# Patient Record
Sex: Female | Born: 1995 | Race: White | Hispanic: No | Marital: Married | Smoking: Never smoker
Health system: Southern US, Community
[De-identification: ages and names within clinical notes are randomized; demographics above are authoritative.]

## PROBLEM LIST (undated history)

## (undated) ENCOUNTER — Emergency Department (HOSPITAL_COMMUNITY): Admission: EM | Payer: Managed Care, Other (non HMO)

## (undated) DIAGNOSIS — N6001 Solitary cyst of right breast: Secondary | ICD-10-CM

## (undated) DIAGNOSIS — D649 Anemia, unspecified: Secondary | ICD-10-CM

## (undated) DIAGNOSIS — F419 Anxiety disorder, unspecified: Secondary | ICD-10-CM

## (undated) DIAGNOSIS — N6002 Solitary cyst of left breast: Secondary | ICD-10-CM

## (undated) DIAGNOSIS — K589 Irritable bowel syndrome without diarrhea: Secondary | ICD-10-CM

## (undated) HISTORY — PX: COLONOSCOPY: SHX174

## (undated) HISTORY — PX: UPPER GI ENDOSCOPY: SHX6162

## (undated) HISTORY — PX: MINOR HEMORRHOIDECTOMY: SHX6238

## (undated) HISTORY — PX: OTHER SURGICAL HISTORY: SHX169

## (undated) HISTORY — DX: Solitary cyst of right breast: N60.02

## (undated) HISTORY — DX: Solitary cyst of right breast: N60.01

## (undated) HISTORY — DX: Anxiety disorder, unspecified: F41.9

## (undated) HISTORY — DX: Irritable bowel syndrome without diarrhea: K58.9

## (undated) HISTORY — DX: Anemia, unspecified: D64.9

## (undated) HISTORY — PX: WISDOM TOOTH EXTRACTION: SHX21

---
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
## 2016-06-27 ENCOUNTER — Encounter: Payer: Self-pay | Admitting: General Surgery

## 2016-06-28 ENCOUNTER — Encounter (INDEPENDENT_AMBULATORY_CARE_PROVIDER_SITE_OTHER): Payer: Self-pay

## 2016-07-05 ENCOUNTER — Encounter: Payer: Self-pay | Admitting: General Surgery

## 2016-07-05 ENCOUNTER — Ambulatory Visit (INDEPENDENT_AMBULATORY_CARE_PROVIDER_SITE_OTHER): Payer: Managed Care, Other (non HMO) | Admitting: General Surgery

## 2016-07-05 VITALS — BP 128/80 | HR 82 | Resp 12 | Ht 66.0 in | Wt 116.0 lb

## 2016-07-05 DIAGNOSIS — N644 Mastodynia: Secondary | ICD-10-CM | POA: Diagnosis not present

## 2016-07-05 NOTE — Progress Notes (Signed)
Patient ID: Savannah Mills, female   DOB: 1995-10-28, 21 y.o.   MRN: [id number]  Chief Complaint  Patient presents with  . Other    Breast Cysts    HPI Savannah Mills is a 21 y.o. female for evaluation of breast cysts. She reports that she has breast cysts that come and go and are painful at times and she has had this since the age of 88. She reports that she had solid feeling breast cysts in both breasts for the past 6 months, but they now have gone away. She reports the cysts tend to come around her periods. She changed birth control about 3-4 months ago.  She has used aspirin and antihistamine for this past flair up. She cut out caffeine from her diet.   HPI  Past Medical History:  Diagnosis Date  . Anemia   . Anxiety   . Bilateral breast cysts   . IBS (irritable bowel syndrome)     Past Surgical History:  Procedure Laterality Date  . COLONOSCOPY  6 years  . ingrown toenail excision    . UPPER GI ENDOSCOPY  6 years ago  . WISDOM TOOTH EXTRACTION      Family History  Problem Relation Age of Onset  . Skin cancer Maternal Grandmother   . Breast cancer Maternal Aunt     Social History Social History  Substance Use Topics  . Smoking status: Never Smoker  . Smokeless tobacco: Never Used  . Alcohol use Yes    No Known Allergies  Current Outpatient Prescriptions  Medication Sig Dispense Refill  . norelgestromin-ethinyl estradiol (ORTHO EVRA) 150-35 MCG/24HR transdermal patch Place 1 patch onto the skin once a week.      No current facility-administered medications for this visit.     Review of Systems Review of Systems  Constitutional: Negative.   Respiratory: Negative.   Cardiovascular: Negative.     Blood pressure 128/80, pulse 82, resp. rate 12, height  (1.676 m), weight 116 lb (52.6 kg), last menstrual period 06/30/2016.  Physical Exam Physical Exam  Constitutional: She is oriented to person, place, and time. She appears well-developed and  well-nourished.  Eyes: Conjunctivae are normal. No scleral icterus.  Neck: Neck supple.  Cardiovascular: Normal rate, regular rhythm and normal heart sounds.   Pulmonary/Chest: Effort normal and breath sounds normal. Right breast exhibits tenderness (inferrior and lateral). Right breast exhibits no inverted nipple, no mass, no nipple discharge and no skin change (right breast larger than left). Left breast exhibits no inverted nipple, no mass, no nipple discharge, no skin change and no tenderness. Breasts are asymmetrical.    The right breast is 1/2 cup size larger than the left, baseline by patient report.  Lymphadenopathy:    She has no cervical adenopathy.    She has no axillary adenopathy.  Neurological: She is alert and oriented to person, place, and time.  Skin: Skin is warm and dry.  Psychiatric: She has a normal mood and affect.    Data Reviewed GYN notes from 06/07/2006 report bilateral breast tenderness and a history of cyclic moving to daily tenderness. Clinical exam reports "diffuse cystic breasts with diffuse tenderness). Breast asymmetry noted. Trial of ibuprofen 800 mg 3 times a day 1 month with caffeine cessation without reported benefit.  Assessment    Clinical history consistent with breast cyst, none evident on today's exam, mastalgia.    Plan    At this time there is no indication for ultrasound. With a benign clinical exam  if cyst right identified aspiration would not be of benefit, if cyst were not identified it would not disprove the clinical suspicion of cyclical breast cysts.  Patient has been encouraged to make use of a trial of antioxidant vitamins such as Ocuvite/Protegra twice a day expecting results in about 3 months if inflammation is a significant component to her discomfort.  No indication for change in contraceptives.      HPI, Physical Exam, Assessment and Plan have been scribed under the direction and in the presence of Earline Mayotte,  MD  Carron Brazen, LPN  I have completed the exam and reviewed the above documentation for accuracy and completeness.  I agree with the above.  Museum/gallery conservator has been used and any errors in dictation or transcription are unintentional.  Donnalee Curry, M.D., F.A.C.S.   Earline Mayotte 07/06/2016, 6:39 AM

## 2016-07-05 NOTE — Patient Instructions (Addendum)
Continue self breast exams. Call office for any new breast issues or concerns.  Call for follow up if breast cysts occur.  May use Ocuvite with Lutein or Protegra twice daily for the next 3 months.  Send My chart message regarding supplements after 3 months.

## 2016-07-06 ENCOUNTER — Encounter: Payer: Self-pay | Admitting: *Deleted

## 2016-07-06 DIAGNOSIS — N644 Mastodynia: Secondary | ICD-10-CM | POA: Insufficient documentation

## 2018-03-27 NOTE — L&D Delivery Note (Addendum)
Operative Delivery Note At 9:12 AM a viable female infant was delivered via Vaginal, Forceps.  Presentation: vertex; Position: Right,, Occiput,, Posterior; Station: outlet.  Verbal consent: obtained from patient.  Risks and benefits discussed in detail.  Risks include, but are not limited to the risks of anesthesia, bleeding, infection, damage to maternal tissues, fetal cephalhematoma.  There is also the risk of inability to effect vaginal delivery of the head, or shoulder dystocia that cannot be resolved by established maneuvers, leading to the need for emergency cesarean section.  APGAR: 8, 9; weight  pending.   Placenta status: delivered spontaneously, intact.   Cord: 3VC with the following complications: none.  Cord pH: pending  Anesthesia:  epidural Instruments: Pricilla Holmucker McLane Forceps Episiotomy: None Lacerations: 2nd degree;Vaginal Suture Repair: 3.0 vicryl QBL. Blood Loss (mL):  pending  Mom to postpartum.  Baby to Couplet care / Skin to Skin.  Of note: I was called by Dr. Dalbert GarnetBeasley to see the patient on an emergent basis due to persistent outlet station of the fetal head even with persistent and good maternal effort.  I was briefed on the prior attempts to replace the fetus for rotation, which was unscuccesful for sustained rotation to affect delivery.  The patient was given a bolus of neuraxial anesthesia prior to any procedure.  While I was in the room I attempted to rotate the head between maternal pushing efforts with small success to OP.  Discussed with the patient that options included an attempt at forceps delivery and if no descent of the fetal head and delivery, she would need a c-section by Dr. Dalbert GarnetBeasley.  The alternative was to proceed directly to cesarean delivery.  The patient elected for a brief and gentle attempt with forceps prior to proceeding with cesarean delivery.  Dr. Dalbert GarnetBeasley was present and scrubbed and in the room with me.  The bladder was drained. The patient was consented  for attempted forceps delivery.  Risks were discussed with her in detail.  She opted for attempt at vaginal delivery.  Neonatology was present.   The Vanceucker McLane forceps were applied. The posterior-left blade was first positioned. The anterior blade was then placed with support by Dr. Dalbert GarnetBeasley I insured the blade did not lacerate maternal tissue.  Once positioned, a check was made for clearance above the fetal skin and that the axis of the forceps was along the sagittal suture.  Once assured of all safety checks, gentle downward traction in a straight line was applied.  Movement of the fetal head was noted, but delivery was not imminent.  Between contractions the blades were loosened.  With the second contraction and second maternal push, delivery of the fetal head was imminent and the blades were disarticulated and removed.  Support of the perineum was provided by Heloise Ochoaebecca McVey, CNM, during the procedure.  The shoulders, followed by the rest of the body were delivered without difficulty.  The infant was placed on the mom's chest.  He was immediately vigorous.  Cord clamped and cut after > 1 min delay.  No cord blood obtained.  Blood from the cord for cord pH was obtained, though the tracing was reassuring throughout my involvement in the delivery.  The placenta delivered spontaneously, intact, with a 3-vessel cord.  Second degree perineal laceration repaired by Dr. Dalbert GarnetBeasley with 2-0 and 3-0 Vicryl in standard fashion.  There was a small hemostatic laceration along the right vaginal sidewall at the left of the pubic ramus. All counts correct.  Hemostasis obtained with  IV pitocin, fundal massage, and misoprostol 800 mcg rectally. QBL pending and did not appear excessive while I was in the room.     Prentice Docker, MD 12/15/2018, 9:26 AM    Addendum to Dr. Marisue Brooklyn note as above:  23yo G1P0 at 41+0wks presenting for iol for late term pregnancy. She progressed to fully dilated but with excellent epidural,  had poor pushing efforts with CNM. Laboring down for two hours with increased maternal sensation, she began pushing over an intact perineum. After almost 4 hours of pushing, she began making more consistent progress. I entered the room for evaluation. I found an ROT baby with a persistent Cat I strip, clear fluid, and no maternal fever or fetal tachycardia.  After several pushes with excellent effort and strong uterine contractions, no movement of the ROT head was noted. Caput was noted, with mildly overriding sutures,. I de-stationed and flexed the fetal head, trying to turn to direct OP or OA position, and was minimally successful. After discussing with patient, I placed a kiwi just anterior to the fetal vertex and pulled gently to see if I could turn the baby enough to extend under the pubic arch. This was also unsuccessful. I did not have a pop-off and did not pull firmly for delivery. However, I was able to see that forceps placement instead of a true trial of the vacuum would be more likely to be successful, and asked Dr. Glennon Mac to assess. He agreed to attempt an operative vaginal delivery as above.

## 2018-05-23 ENCOUNTER — Other Ambulatory Visit: Payer: Self-pay | Admitting: Obstetrics and Gynecology

## 2018-05-23 DIAGNOSIS — Z369 Encounter for antenatal screening, unspecified: Secondary | ICD-10-CM

## 2018-05-23 LAB — OB RESULTS CONSOLE HEPATITIS B SURFACE ANTIGEN: Hepatitis B Surface Ag: NEGATIVE

## 2018-05-23 LAB — OB RESULTS CONSOLE HIV ANTIBODY (ROUTINE TESTING): HIV: NONREACTIVE

## 2018-05-23 LAB — OB RESULTS CONSOLE GC/CHLAMYDIA
Chlamydia: NEGATIVE
Gonorrhea: NEGATIVE

## 2018-05-23 LAB — OB RESULTS CONSOLE RUBELLA ANTIBODY, IGM: Rubella: NON-IMMUNE/NOT IMMUNE

## 2018-05-23 LAB — OB RESULTS CONSOLE RPR: RPR: NONREACTIVE

## 2018-05-23 LAB — OB RESULTS CONSOLE VARICELLA ZOSTER ANTIBODY, IGG: Varicella: NON-IMMUNE/NOT IMMUNE

## 2018-05-30 ENCOUNTER — Ambulatory Visit: Payer: Self-pay

## 2018-06-03 ENCOUNTER — Ambulatory Visit
Admission: RE | Admit: 2018-06-03 | Discharge: 2018-06-03 | Disposition: A | Discharge status: Home / Self Care | Payer: Managed Care, Other (non HMO) | Source: Ambulatory Visit | Attending: Obstetrics and Gynecology | Admitting: Obstetrics and Gynecology

## 2018-06-03 DIAGNOSIS — Z3A13 13 weeks gestation of pregnancy: Secondary | ICD-10-CM | POA: Diagnosis not present

## 2018-06-03 DIAGNOSIS — Z369 Encounter for antenatal screening, unspecified: Secondary | ICD-10-CM

## 2018-06-03 NOTE — Progress Notes (Signed)
Savannah Mills of Consultation: 30 minutes   Savannah Mills  was referred to [facility] [place] for genetic counseling to review prenatal screening and testing options.  This note summarizes the information we discussed.    Genetic Counseling We offered the following routine screening tests for this pregnancy:  First trimester screening, which includes nuchal translucency ultrasound screen and first trimester maternal serum marker screening.  The nuchal translucency has approximately an 80% detection rate for Down syndrome and can be positive for other chromosome abnormalities as well as congenital heart defects.  When combined with a maternal serum marker screening, the detection rate is up to 90% for Down syndrome and up to 97% for trisomy 18.     Maternal serum marker screening, a blood test that measures pregnancy proteins, can provide risk assessments for Down syndrome, trisomy 18, and open neural tube defects (spina bifida, anencephaly). Because it does not directly examine the fetus, it cannot positively diagnose or rule out these problems.  Targeted ultrasound uses high frequency sound waves to create an image of the developing fetus.  An ultrasound is often recommended as a routine means of evaluating the pregnancy.  It is also used to screen for fetal anatomy problems (for example, a heart defect) that might be suggestive of a chromosomal or other abnormality.   Should these screening tests indicate an increased concern, then the following additional testing options would be offered:  The chorionic villus sampling procedure is available for first trimester chromosome analysis.  This involves the withdrawal of a small amount of chorionic villi (tissue from the developing placenta).  Risk of pregnancy loss is estimated to be approximately 1 in 200 to 1 in 100 (0.5 to 1%).  There is approximately a 1% (1 in 100) chance that the CVS chromosome results will be  unclear.  Chorionic villi cannot be tested for neural tube defects.     Amniocentesis involves the removal of a small amount of amniotic fluid from the sac surrounding the fetus with the use of a thin needle inserted through the maternal abdomen and uterus.  Ultrasound guidance is used throughout the procedure.  Fetal cells from amniotic fluid are directly evaluated and > 99.5% of chromosome problems and > 98% of open neural tube defects can be detected. This procedure is generally performed after the 15th week of pregnancy.  The main risks to this procedure include complications leading to miscarriage in less than 1 in 200 cases (0.5%).  As another option for information if the pregnancy is suspected to be an an increased chance for certain chromosome conditions, we also reviewed the availability of cell free fetal DNA testing from maternal blood to determine whether or not the baby may have either Down syndrome, trisomy 77, or trisomy 50.  This test utilizes a maternal blood sample and DNA sequencing technology to isolate circulating cell free fetal DNA from maternal plasma.  The fetal DNA can then be analyzed for DNA sequences that are derived from the three most common chromosomes involved in aneuploidy, chromosomes 13, 18, and 21.  If the overall amount of DNA is greater than the expected level for any of these chromosomes, aneuploidy is suspected.  While we do not consider it a replacement for invasive testing and karyotype analysis, a negative result from this testing would be reassuring, though not a guarantee of a normal chromosome complement for the baby.  An abnormal result is certainly suggestive of an abnormal chromosome complement, though we would  still recommend CVS or amniocentesis to confirm any findings from this testing.  Cystic Fibrosis and Spinal Muscular Atrophy (SMA) screening were also discussed with the patient. Both conditions are recessive, which means that both parents must be  carriers in order to have a child with the disease.  Cystic fibrosis (CF) is one of the most common genetic conditions in persons of Caucasian ancestry.  This condition occurs in approximately 1 in 2,500 Caucasian persons and results in thickened secretions in the lungs, digestive, and reproductive systems.  For a baby to be at risk for having CF, both of the parents must be carriers for this condition.  Approximately 1 in 81 Caucasian persons is a carrier for CF.  Current carrier testing looks for the most common mutations in the gene for CF and can detect approximately 90% of carriers in the Caucasian population.  This means that the carrier screening can greatly reduce, but cannot eliminate, the chance for an individual to have a child with CF.  If an individual is found to be a carrier for CF, then carrier testing would be available for the partner. As part of [place] newborn screening profile, all babies born in the state of [place] will have a two-tier screening process.  Specimens are first tested to determine the concentration of immunoreactive trypsinogen (IRT).  The top 5% of specimens with the highest IRT values then undergo DNA testing using a panel of over 40 common CF mutations. SMA is a neurodegenerative disorder that leads to atrophy of skeletal muscle and overall weakness.  This condition is also more prevalent in the Caucasian population, with 1 in 40-1 in 60 persons being a carrier and 1 in 6,000-1 in 10,000 children being affected.  There are multiple forms of the disease, with some causing death in infancy to other forms with survival into adulthood.  The genetics of SMA is complex, but carrier screening can detect up to 95% of carriers in the Caucasian population.  Similar to CF, a negative result can greatly reduce, but cannot eliminate, the chance to have a child with SMA.  Pregnancy/Family History We obtained a detailed family history and pregnancy history.This is the 1st  pregnancy for the patient and her husband, Savannah Mills, age 48.  Savannah Mills reports that her maternal aunt lost a daughter at 31 months of age.  She was born with a congenital heart defect and passed away during her 2nd operation.  The patient has no other details regarding her cousin's diagnosis.  We discussed that congenital heart defects (CHDs) occur in approximately 1 in 200 births.  Congenital heart defects are often thought to be multifactorial, meaning due to complex interactions between genetic and environmental factors, although some specific genetic changes and syndromes are associated with congenital heart defects.  It would be important to know what tests were done on her cousin in order to better assess the risk in this family.   In the absence of an underlying genetic condition, the chance of congenital heart defects in 2nd degree relatives with a CHD is thought to be low. We reviewed the utility of a 2nd trimester anatomy scan in assessing fetal heart development.  The patient reports Caucasian ancestry for herself and her partner. The remainder of the family history was reported to be unremarkable for birth defects, intellectual delays, recurrent pregnancy loss or known chromosome abnormalities.  Savannah Mills denies any pregnancy complications.  She denies significant exposures of drug use during pregnancy.    Plan  After consideration of the options, Savannah Mills elected to proceed with first trimester screening.  She declines carrier screening.  An ultrasound was performed at the time of the visit.  The gestational age was consistent with [redacted]w[redacted]d.  NT measurement is 1.6 mm.  Fetal anatomy could not be assessed due to early gestational age.  Please refer to the ultrasound report for details of that study.  Savannah Mills was encouraged to call with questions or concerns.  We can be contacted at [phone].  Labs ordered: First trimester screen ([facility])  Savannah Ferron, MS, CGC performed an  integral service incident to the physician's initial service.  I was physically present in the clinical area and was immediately available to render assistance.   Sufian Ravi C Mckenna Boruff

## 2018-06-05 LAB — MISC LABCORP TEST (SEND OUT): LabCorp test name: 17500

## 2018-06-10 ENCOUNTER — Telehealth: Payer: Self-pay | Admitting: Obstetrics and Gynecology

## 2018-06-10 NOTE — Telephone Encounter (Signed)
  Savannah Mills elected to undergo First Trimester screening on 06/03/2018.  To review, first trimester screening, includes nuchal translucency ultrasound screen and/or first trimester maternal serum marker screening.  The nuchal translucency has approximately an 80% detection rate for Down syndrome and can be positive for other chromosome abnormalities as well as heart defects.  When combined with a maternal serum marker screening, the detection rate is up to 90% for Down syndrome and up to 97% for trisomy 18.     The results of the First Trimester Nuchal Translucency and Biochemical Screening were within normal range.  The risk for Down syndrome is now estimated to be less than 1 in 10,000.  The risk for Trisomy 77 is also estimated to be less than 1 in 10,000. This result does not include assessment for Trisomy 13, as the testing was sent to [facility] per patient request due to insurance coverage.  Should more definitive information be desired, we would offer amniocentesis.  Because we do not yet know the effectiveness of combined first and second trimester screening, we do not recommend a maternal serum screen to assess the chance for chromosome conditions.  However, if screening for neural tube defects is desired, maternal serum screening for AFP only can be performed between 15 and [redacted] weeks gestation.    We may be reached at [phone] with questions or concerns.  Savannah Anderson, MS, CGC

## 2018-11-11 LAB — OB RESULTS CONSOLE GC/CHLAMYDIA
Chlamydia: NEGATIVE
Gonorrhea: NEGATIVE

## 2018-11-11 LAB — OB RESULTS CONSOLE GBS: GBS: NEGATIVE

## 2018-11-20 LAB — OB RESULTS CONSOLE RPR: RPR: NONREACTIVE

## 2018-12-10 ENCOUNTER — Other Ambulatory Visit: Payer: Self-pay | Admitting: Certified Nurse Midwife

## 2018-12-10 NOTE — Progress Notes (Signed)
  Savannah Mills is a 23 y.o. G1P0 female dated by LMP c/w [redacted]w[redacted]d ultrasound on 04/25/2018.     Pregnancy Issues: 1. Hemorrhoids 2. 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 Varicella non-immune 4. Rubella non-immune 5. Anemia on iron supplementation 6. Fetal pyelectasis, right renal pelvis=0.63cm left renal pelvis=0.60cm at 32w  Prenatal care site: [facility]  Prenatal Labs: Blood type/Rh AB+  Antibody screen neg  Rubella Non-immune  Varicella Non-immune  RPR NR  HBsAg Neg  HIV NR  GC neg  Chlamydia neg  Genetic screening negative  1 hour GTT 123  3 hour GTT n/a  GBS negative   Post Partum Planning: - Infant feeding: breast - Contraception: interested in hormonal IUD

## 2018-12-12 ENCOUNTER — Other Ambulatory Visit
Admission: RE | Admit: 2018-12-12 | Discharge: 2018-12-12 | Disposition: A | Discharge status: Home / Self Care | Payer: Managed Care, Other (non HMO) | Source: Ambulatory Visit | Attending: Obstetrics & Gynecology | Admitting: Obstetrics & Gynecology

## 2018-12-12 ENCOUNTER — Other Ambulatory Visit: Payer: Self-pay

## 2018-12-12 DIAGNOSIS — Z01812 Encounter for preprocedural laboratory examination: Secondary | ICD-10-CM | POA: Insufficient documentation

## 2018-12-12 DIAGNOSIS — Z20828 Contact with and (suspected) exposure to other viral communicable diseases: Secondary | ICD-10-CM | POA: Diagnosis not present

## 2018-12-12 LAB — SARS CORONAVIRUS 2 (TAT 6-24 HRS): SARS Coronavirus 2: NEGATIVE

## 2018-12-14 ENCOUNTER — Inpatient Hospital Stay
Admission: EM | Admit: 2018-12-14 | Discharge: 2018-12-17 | DRG: 806 | Disposition: A | Discharge status: Home / Self Care | Payer: Managed Care, Other (non HMO) | Attending: Obstetrics and Gynecology | Admitting: Obstetrics and Gynecology

## 2018-12-14 ENCOUNTER — Inpatient Hospital Stay: Payer: Managed Care, Other (non HMO) | Admitting: Anesthesiology

## 2018-12-14 ENCOUNTER — Other Ambulatory Visit: Payer: Self-pay

## 2018-12-14 DIAGNOSIS — D649 Anemia, unspecified: Secondary | ICD-10-CM | POA: Diagnosis present

## 2018-12-14 DIAGNOSIS — Z20828 Contact with and (suspected) exposure to other viral communicable diseases: Secondary | ICD-10-CM | POA: Diagnosis present

## 2018-12-14 DIAGNOSIS — A6000 Herpesviral infection of urogenital system, unspecified: Secondary | ICD-10-CM | POA: Diagnosis present

## 2018-12-14 DIAGNOSIS — O9902 Anemia complicating childbirth: Principal | ICD-10-CM | POA: Diagnosis present

## 2018-12-14 DIAGNOSIS — O26893 Other specified pregnancy related conditions, third trimester: Secondary | ICD-10-CM | POA: Diagnosis present

## 2018-12-14 DIAGNOSIS — Z3A41 41 weeks gestation of pregnancy: Secondary | ICD-10-CM | POA: Diagnosis not present

## 2018-12-14 DIAGNOSIS — O480 Post-term pregnancy: Secondary | ICD-10-CM | POA: Diagnosis not present

## 2018-12-14 DIAGNOSIS — Z3A40 40 weeks gestation of pregnancy: Secondary | ICD-10-CM | POA: Diagnosis not present

## 2018-12-14 DIAGNOSIS — O358XX0 Maternal care for other (suspected) fetal abnormality and damage, not applicable or unspecified: Secondary | ICD-10-CM | POA: Diagnosis present

## 2018-12-14 DIAGNOSIS — O9832 Other infections with a predominantly sexual mode of transmission complicating childbirth: Secondary | ICD-10-CM | POA: Diagnosis present

## 2018-12-14 DIAGNOSIS — O2243 Hemorrhoids in pregnancy, third trimester: Secondary | ICD-10-CM | POA: Diagnosis present

## 2018-12-14 LAB — CBC
HCT: 32.1 % — ABNORMAL LOW (ref 36.0–46.0)
Hemoglobin: 10.9 g/dL — ABNORMAL LOW (ref 12.0–15.0)
MCH: 28.6 pg (ref 26.0–34.0)
MCHC: 34 g/dL (ref 30.0–36.0)
MCV: 84.3 fL (ref 80.0–100.0)
Platelets: 174 10*3/uL (ref 150–400)
RBC: 3.81 MIL/uL — ABNORMAL LOW (ref 3.87–5.11)
RDW: 15.7 % — ABNORMAL HIGH (ref 11.5–15.5)
WBC: 9.8 10*3/uL (ref 4.0–10.5)
nRBC: 0 % (ref 0.0–0.2)

## 2018-12-14 LAB — TYPE AND SCREEN
ABO/RH(D): AB POS
Antibody Screen: NEGATIVE

## 2018-12-14 LAB — RPR: RPR Ser Ql: NONREACTIVE

## 2018-12-14 MED ORDER — FENTANYL CITRATE (PF) 100 MCG/2ML IJ SOLN
INTRAMUSCULAR | Status: AC
  Filled 2018-12-14: qty 2

## 2018-12-14 MED ORDER — LACTATED RINGERS IV SOLN: 500.0000 mL | INTRAVENOUS | Status: DC | PRN

## 2018-12-14 MED ORDER — AMMONIA AROMATIC IN INHA
RESPIRATORY_TRACT | Status: AC
  Filled 2018-12-14: qty 10

## 2018-12-14 MED ORDER — LACTATED RINGERS IV SOLN
INTRAVENOUS | Status: DC
  Administered 2018-12-14 – 2018-12-15 (×2): via INTRAVENOUS

## 2018-12-14 MED ORDER — OXYTOCIN 10 UNIT/ML IJ SOLN
INTRAMUSCULAR | Status: AC
  Filled 2018-12-14: qty 2

## 2018-12-14 MED ORDER — LIDOCAINE HCL (PF) 1 % IJ SOLN
INTRAMUSCULAR | Status: AC
  Filled 2018-12-14: qty 30

## 2018-12-14 MED ORDER — FENTANYL 2.5 MCG/ML W/ROPIVACAINE 0.15% IN NS 100 ML EPIDURAL (ARMC)
EPIDURAL | Status: DC | PRN
  Administered 2018-12-14: 12 mL/h via EPIDURAL

## 2018-12-14 MED ORDER — OXYTOCIN 40 UNITS IN NORMAL SALINE INFUSION - SIMPLE MED: 2.5000 [IU]/h | INTRAVENOUS | Status: DC

## 2018-12-14 MED ORDER — TERBUTALINE SULFATE 1 MG/ML IJ SOLN: 0.2500 mg | Freq: Once | INTRAMUSCULAR | Status: DC | PRN

## 2018-12-14 MED ORDER — OXYTOCIN 40 UNITS IN NORMAL SALINE INFUSION - SIMPLE MED
1.0000 m[IU]/min | INTRAVENOUS | Status: DC
  Administered 2018-12-14: 1 m[IU]/min via INTRAVENOUS
  Filled 2018-12-14: qty 1000

## 2018-12-14 MED ORDER — ACETAMINOPHEN 325 MG PO TABS
650.0000 mg | ORAL_TABLET | ORAL | Status: DC | PRN
  Administered 2018-12-15: 650 mg via ORAL
  Filled 2018-12-14: qty 2

## 2018-12-14 MED ORDER — MISOPROSTOL 100 MCG PO TABS
25.0000 ug | ORAL_TABLET | ORAL | Status: DC | PRN
  Administered 2018-12-14 (×2): 25 ug via BUCCAL
  Filled 2018-12-14 (×2): qty 1

## 2018-12-14 MED ORDER — FENTANYL 2.5 MCG/ML W/ROPIVACAINE 0.15% IN NS 100 ML EPIDURAL (ARMC)
EPIDURAL | Status: AC
  Filled 2018-12-14: qty 100

## 2018-12-14 MED ORDER — EPHEDRINE 5 MG/ML INJ
10.0000 mg | INTRAVENOUS | Status: DC | PRN
  Filled 2018-12-14: qty 2

## 2018-12-14 MED ORDER — SOD CITRATE-CITRIC ACID 500-334 MG/5ML PO SOLN
30.0000 mL | ORAL | Status: DC | PRN
  Filled 2018-12-14: qty 30

## 2018-12-14 MED ORDER — BUTORPHANOL TARTRATE 1 MG/ML IJ SOLN: 1.0000 mg | INTRAMUSCULAR | Status: DC | PRN

## 2018-12-14 MED ORDER — MISOPROSTOL 200 MCG PO TABS
ORAL_TABLET | ORAL | Status: AC
  Filled 2018-12-14: qty 4

## 2018-12-14 MED ORDER — OXYTOCIN BOLUS FROM INFUSION
500.0000 mL | Freq: Once | INTRAVENOUS | Status: AC
  Administered 2018-12-15: 500 mL via INTRAVENOUS

## 2018-12-14 MED ORDER — FENTANYL 2.5 MCG/ML W/ROPIVACAINE 0.15% IN NS 100 ML EPIDURAL (ARMC)
12.0000 mL/h | EPIDURAL | Status: DC
  Administered 2018-12-14: 12 mL/h via EPIDURAL
  Administered 2018-12-15: 09:00:00 9 ug via EPIDURAL
  Filled 2018-12-14: qty 100

## 2018-12-14 MED ORDER — FENTANYL CITRATE (PF) 100 MCG/2ML IJ SOLN
100.0000 ug | INTRAMUSCULAR | Status: DC | PRN
  Administered 2018-12-14: 100 ug via INTRAVENOUS

## 2018-12-14 MED ORDER — DIPHENHYDRAMINE HCL 50 MG/ML IJ SOLN: 12.5000 mg | INTRAMUSCULAR | Status: DC | PRN

## 2018-12-14 MED ORDER — LIDOCAINE HCL (PF) 1 % IJ SOLN
30.0000 mL | INTRAMUSCULAR | Status: AC | PRN
  Administered 2018-12-14: 17:00:00 1.2 mL via SUBCUTANEOUS

## 2018-12-14 MED ORDER — PHENYLEPHRINE 40 MCG/ML (10ML) SYRINGE FOR IV PUSH (FOR BLOOD PRESSURE SUPPORT)
80.0000 ug | PREFILLED_SYRINGE | INTRAVENOUS | Status: DC | PRN
  Filled 2018-12-14: qty 10

## 2018-12-14 MED ORDER — MISOPROSTOL 25 MCG QUARTER TABLET
ORAL_TABLET | ORAL | Status: AC
  Filled 2018-12-14: qty 2

## 2018-12-14 MED ORDER — LACTATED RINGERS IV SOLN: 500.0000 mL | Freq: Once | INTRAVENOUS | Status: DC

## 2018-12-14 MED ORDER — LIDOCAINE-EPINEPHRINE (PF) 1.5 %-1:200000 IJ SOLN
INTRAMUSCULAR | Status: DC | PRN
  Administered 2018-12-14: 3 mL via EPIDURAL

## 2018-12-14 MED ORDER — OXYTOCIN 40 UNITS IN NORMAL SALINE INFUSION - SIMPLE MED
INTRAVENOUS | Status: AC
  Filled 2018-12-14: qty 1000

## 2018-12-14 MED ORDER — MISOPROSTOL 25 MCG QUARTER TABLET
25.0000 ug | ORAL_TABLET | ORAL | Status: DC | PRN
  Administered 2018-12-14 (×2): 25 ug via VAGINAL
  Filled 2018-12-14 (×2): qty 1

## 2018-12-14 MED ORDER — ONDANSETRON HCL 4 MG/2ML IJ SOLN
4.0000 mg | Freq: Four times a day (QID) | INTRAMUSCULAR | Status: DC | PRN
  Administered 2018-12-14: 4 mg via INTRAVENOUS
  Filled 2018-12-14: qty 2

## 2018-12-14 NOTE — Anesthesia Procedure Notes (Signed)
Epidural Patient location during procedure: OB Start time: 12/14/2018 4:25 PM End time: 12/14/2018 4:50 PM  Staffing Performed: anesthesiologist   Preanesthetic Checklist Completed: patient identified, site marked, surgical consent, pre-op evaluation, timeout performed, IV checked, risks and benefits discussed and monitors and equipment checked  Epidural Patient position: sitting Prep: Betadine Patient monitoring: heart rate, continuous pulse ox and blood pressure Approach: midline Location: L4-L5 Injection technique: LOR saline  Needle:  Needle type: Tuohy  Needle gauge: 17 G Needle length: 9 cm and 9 Needle insertion depth: 8 cm Catheter type: closed end flexible Catheter size: 20 Guage Catheter at skin depth: 12 cm Test dose: negative and 1.5% lidocaine with Epi 1:200 K  Assessment Events: blood not aspirated, injection not painful, no injection resistance, negative IV test and no paresthesia  Additional Notes   Patient tolerated the insertion well without complications.Reason for block:procedure for pain

## 2018-12-14 NOTE — Progress Notes (Signed)
Labor Progress Note  Savannah Mills is a 23 y.o. G1P0 at [redacted]w[redacted]d by LMP admitted for induction of labor due to late term.  Subjective:  Hurting with UCs, requesting pain meds prior to cervical exam.   Objective: BP 115/82   Pulse 80   Temp 98.5 F (36.9 C) (Oral)   Resp 16   Ht 5\' 6"  (1.676 m)   Wt 73.9 kg   LMP 03/03/2018   SpO2 100%   BMI 26.31 kg/m  Notable VS details: reviewed  Fetal Assessment: FHT:  FHR: 150 bpm, variability: moderate,  accelerations:  Present,  decelerations:  Absent Category/reactivity:  Category I UC:  Q5min SVE:   2/90/-2, soft, midposition Membrane status: ruptured, scant amount clear fluid around 1400 per pt.  Amniotic color: clear  Labs: Lab Results  Component Value Date   WBC 9.8 12/14/2018   HGB 10.9 (L) 12/14/2018   HCT 32.1 (L) 12/14/2018   MCV 84.3 12/14/2018   PLT 174 12/14/2018    Assessment / Plan: IOl at 40+6wks, early labor  Labor: progressing after 2 doses of cytotec and SROM.  Preeclampsia:  no e/o pre-e Fetal Wellbeing:  Category I Pain Control:  IV pain meds I/D:  n/a Anticipated MOD:  NSVD  Francetta Found, CNM 12/14/2018, 1530

## 2018-12-14 NOTE — H&P (Signed)
OB History & Physical   History of Present Illness:  Chief Complaint: here for induction  HPI:  Savannah Mills is a 23 y.o. G1P0 female at [redacted]w[redacted]d dated by LMP c/w [redacted]w[redacted]d ultrasound on 04/25/2018.  She presents to L&D for late term induction.   Active FM; not feeling any UCs, denies LOF or VB. Denies HSV prodrome sx.    Pregnancy Issues: 1. Hemorrhoids 2. 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 Varicella non-immune 4. Rubella non-immune 5. Anemia on iron supplementation 6. Fetal pyelectasis, right renal pelvis=0.63cm left renal pelvis=0.60cm at 32w   Maternal Medical History:   Past Medical History:  Diagnosis Date  . Anemia   . Anxiety   . Bilateral breast cysts   . IBS (irritable bowel syndrome)     Past Surgical History:  Procedure Laterality Date  . COLONOSCOPY  6 years  . ingrown toenail excision    . MINOR HEMORRHOIDECTOMY    . UPPER GI ENDOSCOPY  6 years ago  . WISDOM TOOTH EXTRACTION      Allergies  Allergen Reactions  . Cinnamon Anaphylaxis    Prior to Admission medications   Medication Sig Start Date End Date Taking? Authorizing Provider  cetirizine (ZYRTEC) 5 MG tablet Take 5 mg by mouth daily.   Yes [provider]  ferrous sulfate 325 (65 FE) MG tablet Take 325 mg by mouth daily with breakfast.   Yes [provider]  Prenatal Vit-Fe Fumarate-FA (PRENATAL MULTIVITAMIN) TABS tablet Take 1 tablet by mouth daily at 12 noon.   Yes [provider]  valACYclovir (VALTREX) 500 MG tablet Take 500 mg by mouth 2 (two) times daily.   Yes [provider]     Prenatal care site: [facility] History: She  reports that she has never smoked. She has never used smokeless tobacco. She reports previous alcohol use. She reports that she does not use drugs.  Family History: family history includes Alcoholism in her mother; Anxiety disorder in her brother; Breast cancer in her maternal  aunt; Cancer in her maternal grandmother and mother; Depression in her mother and sister; Diabetes in her maternal grandfather; Skin cancer in her maternal grandmother.   Review of Systems: A full review of systems was performed and negative except as noted in the HPI.     Physical Exam:  Vital Signs: BP 129/81   Pulse 66   Temp 98.4 F (36.9 C) (Oral)   Resp 14   Ht 5\' 6"  (1.676 m)   Wt 73.9 kg   LMP 03/03/2018   BMI 26.31 kg/m  General: no acute distress.  HEENT: normocephalic, atraumatic Heart: regular rate & rhythm.  No murmurs/rubs/gallops Lungs: clear to auscultation bilaterally, normal respiratory effort Abdomen: soft, gravid, non-tender;  EFW: 7lbs Pelvic: cervix posterior and located to pt left, soft and thinning.   External: Normal external female genitalia, negative spec exam- no e/o HSV lesions.   Cervix: Dilation: Closed / Effacement (%): 50 / Station: -2    Extremities: non-tender, symmetric, no edema bilaterally.  DTRs: 2+  Neurologic: Alert & oriented x 3.    Results for orders placed or performed during the hospital encounter of 12/14/18 (from the past 24 hour(s))  CBC     Status: Abnormal   Collection Time: 12/14/18  6:10 AM  Result Value Ref Range   WBC 9.8 4.0 - 10.5 K/uL   RBC 3.81 (L) 3.87 - 5.11 MIL/uL  Hemoglobin 10.9 (L) 12.0 - 15.0 g/dL   HCT 16.132.1 (L) 09.636.0 - 04.546.0 %   MCV 84.3 80.0 - 100.0 fL   MCH 28.6 26.0 - 34.0 pg   MCHC 34.0 30.0 - 36.0 g/dL   RDW 40.915.7 (H) 81.111.5 - 91.415.5 %   Platelets 174 150 - 400 K/uL   nRBC 0.0 0.0 - 0.2 %  Type and screen     Status: None   Collection Time: 12/14/18  6:10 AM  Result Value Ref Range   ABO/RH(D) AB POS    Antibody Screen NEG    Sample Expiration      12/17/2018,2359 Performed at [facility], [address] [place], [place] [address]    COVID: negative 9/17  Pertinent Results:  Prenatal Labs: Blood type/Rh  AB Pos  Antibody screen neg  Rubella  NON-Immune  Varicella  NON-Immune  RPR  NR  HBsAg Neg  HIV NR  GC neg  Chlamydia neg  Genetic screening negative  1 hour GTT  123  GBS  negative   FHT:  140bpm, moderate variability, + accels, no decels TOCO: Irregular, q 2-515min, mild SVE:  Dilation: Closed / Effacement (%): 50 / Station: -2    Cephalic by leopolds  No results found.  Assessment:  Savannah Mills is a 23 y.o. G1P0 female at 457w6d with late term induction of labor.   Plan:  1. Admit to Labor & Delivery; consents reviewed and obtained - COVID negative - HSV hx and on prophy- no lesions on spec exam  2. Fetal Well being  - Fetal Tracing: Cat I tracing - Group B Streptococcus ppx indicated: negative - Presentation: cephalic confirmed by exam   3. Routine OB: - Prenatal labs reviewed, as above - Rh AB Pos - CBC, T&S, RPR on admit - Clear fluids, IVF  4. Induction of Labor -  Contractions: external toco in place -  Pelvis unproven, adequate for TOL -  Plan for induction with cytotec for ripening, discussed cook catheter, pitocin and AROM.  -  Plan for continuous fetal monitoring  -  Maternal pain control as desired - Anticipate vaginal delivery  5. Post Partum Planning: - Infant feeding: breast - Contraception: TBD  Randa NgoRebecca A McVey, CNM 12/14/18 10:09 AM

## 2018-12-14 NOTE — Anesthesia Preprocedure Evaluation (Signed)
Anesthesia Evaluation  Patient identified by MRN, date of birth, ID band Patient awake    Reviewed: Allergy & Precautions, NPO status , Patient's Chart, lab work & pertinent test results  History of Anesthesia Complications Negative for: history of anesthetic complications  Airway Mallampati: II       Dental   Pulmonary neg sleep apnea, neg COPD, Not current smoker,           Cardiovascular (-) hypertension(-) Past MI and (-) CHF (-) dysrhythmias (-) Valvular Problems/Murmurs     Neuro/Psych neg Seizures Anxiety    GI/Hepatic Neg liver ROS, neg GERD  ,  Endo/Other  neg diabetes  Renal/GU negative Renal ROS     Musculoskeletal   Abdominal   Peds  Hematology  (+) anemia ,   Anesthesia Other Findings   Reproductive/Obstetrics                             Anesthesia Physical Anesthesia Plan  ASA: II  Anesthesia Plan: Epidural   Post-op Pain Management:    Induction:   PONV Risk Score and Plan:   Airway Management Planned:   Additional Equipment:   Intra-op Plan:   Post-operative Plan:   Informed Consent: I have reviewed the patients History and Physical, chart, labs and discussed the procedure including the risks, benefits and alternatives for the proposed anesthesia with the patient or authorized representative who has indicated his/her understanding and acceptance.       Plan Discussed with:   Anesthesia Plan Comments:         Anesthesia Quick Evaluation

## 2018-12-14 NOTE — Progress Notes (Signed)
Labor Progress Note  Savannah Mills is a 23 y.o. G1P0 at [redacted]w[redacted]d by LMP admitted for induction of labor due to late term.  Subjective:  Comfortable with epidural, right more numb than left. Nausea and shaking.   Objective: BP 115/82   Pulse 80   Temp 98.5 F (36.9 C) (Oral)   Resp 16   Ht 5\' 6"  (1.676 m)   Wt 73.9 kg   LMP 03/03/2018   SpO2 100%   BMI 26.31 kg/m  Notable VS details: reviewed  Fetal Assessment: FHT:  FHR: 140 bpm, variability: moderate,  accelerations:  Present,  decelerations:  Absent  Category/reactivity:  Category I UC:  Q24min, Pitocin at 85mu/min SVE:   8/C/0, soft, anterior, moderate bloody show noted.  Membrane status: ruptured, scant amount clear fluid around 1400 per pt.  Amniotic color: clear  Labs: Lab Results  Component Value Date   WBC 9.8 12/14/2018   HGB 10.9 (L) 12/14/2018   HCT 32.1 (L) 12/14/2018   MCV 84.3 12/14/2018   PLT 174 12/14/2018    Assessment / Plan: IOL at 40+6wks, active labor  Labor: progressing after 2 doses of cytotec and SROM. Pitocin started around 1830 Preeclampsia:  no e/o pre-e Fetal Wellbeing:  Category I Pain Control:  Epidural I/D:  n/a Anticipated MOD:  NSVD  Francetta Found, CNM 12/14/2018  8:38 PM

## 2018-12-14 NOTE — Plan of Care (Signed)
Admission education complete.

## 2018-12-15 ENCOUNTER — Encounter: Payer: Self-pay | Admitting: *Deleted

## 2018-12-15 DIAGNOSIS — Z3A41 41 weeks gestation of pregnancy: Secondary | ICD-10-CM

## 2018-12-15 DIAGNOSIS — O480 Post-term pregnancy: Secondary | ICD-10-CM

## 2018-12-15 MED ORDER — MISOPROSTOL 200 MCG PO TABS
ORAL_TABLET | ORAL | Status: AC
  Administered 2018-12-15: 800 ug
  Filled 2018-12-15: qty 5

## 2018-12-15 MED ORDER — BENZOCAINE-MENTHOL 20-0.5 % EX AERO
1.0000 "application " | INHALATION_SPRAY | CUTANEOUS | Status: DC | PRN
  Administered 2018-12-15 – 2018-12-17 (×3): 1 via TOPICAL
  Filled 2018-12-15 (×3): qty 56

## 2018-12-15 MED ORDER — METHYLERGONOVINE MALEATE 0.2 MG/ML IJ SOLN
INTRAMUSCULAR | Status: AC
  Filled 2018-12-15: qty 1

## 2018-12-15 MED ORDER — FENTANYL 2.5 MCG/ML W/ROPIVACAINE 0.15% IN NS 100 ML EPIDURAL (ARMC)
EPIDURAL | Status: AC
  Administered 2018-12-15: 9 ug via EPIDURAL
  Filled 2018-12-15: qty 100

## 2018-12-15 MED ORDER — DIBUCAINE (PERIANAL) 1 % EX OINT
1.0000 "application " | TOPICAL_OINTMENT | CUTANEOUS | Status: DC | PRN
  Administered 2018-12-15 (×2): 1 via RECTAL
  Filled 2018-12-15 (×3): qty 28

## 2018-12-15 MED ORDER — COCONUT OIL OIL
1.0000 "application " | TOPICAL_OIL | Status: DC | PRN
  Administered 2018-12-15: 1 via TOPICAL
  Filled 2018-12-15: qty 120

## 2018-12-15 MED ORDER — SODIUM CHLORIDE 0.9% FLUSH
3.0000 mL | Freq: Two times a day (BID) | INTRAVENOUS | Status: DC
  Administered 2018-12-15: 3 mL via INTRAVENOUS

## 2018-12-15 MED ORDER — IBUPROFEN 600 MG PO TABS
600.0000 mg | ORAL_TABLET | Freq: Four times a day (QID) | ORAL | Status: DC
  Administered 2018-12-15 – 2018-12-17 (×9): 600 mg via ORAL
  Filled 2018-12-15 (×9): qty 1

## 2018-12-15 MED ORDER — DIPHENHYDRAMINE HCL 25 MG PO CAPS: 25.0000 mg | ORAL_CAPSULE | Freq: Four times a day (QID) | ORAL | Status: DC | PRN

## 2018-12-15 MED ORDER — BUPIVACAINE HCL (PF) 0.5 % IJ SOLN
INTRAMUSCULAR | Status: AC
  Filled 2018-12-15: qty 30

## 2018-12-15 MED ORDER — LIDOCAINE HCL URETHRAL/MUCOSAL 2 % EX GEL
1.0000 "application " | Freq: Once | CUTANEOUS | Status: AC
  Administered 2018-12-15: 1 via TOPICAL
  Filled 2018-12-15: qty 5

## 2018-12-15 MED ORDER — ACETAMINOPHEN 500 MG PO TABS
1000.0000 mg | ORAL_TABLET | Freq: Four times a day (QID) | ORAL | Status: DC | PRN
  Administered 2018-12-15 – 2018-12-17 (×6): 1000 mg via ORAL
  Filled 2018-12-15 (×5): qty 2

## 2018-12-15 MED ORDER — SODIUM CHLORIDE 0.9% FLUSH: 3.0000 mL | INTRAVENOUS | Status: DC | PRN

## 2018-12-15 MED ORDER — CARBOPROST TROMETHAMINE 250 MCG/ML IM SOLN
INTRAMUSCULAR | Status: AC
  Filled 2018-12-15: qty 1

## 2018-12-15 MED ORDER — FLEET ENEMA 7-19 GM/118ML RE ENEM: 1.0000 | ENEMA | Freq: Every day | RECTAL | Status: DC | PRN

## 2018-12-15 MED ORDER — ZOLPIDEM TARTRATE 5 MG PO TABS: 5.0000 mg | ORAL_TABLET | Freq: Every evening | ORAL | Status: DC | PRN

## 2018-12-15 MED ORDER — OXYCODONE HCL 5 MG PO TABS
5.0000 mg | ORAL_TABLET | ORAL | Status: DC | PRN
  Administered 2018-12-15 – 2018-12-17 (×4): 5 mg via ORAL
  Filled 2018-12-15 (×4): qty 1

## 2018-12-15 MED ORDER — MEASLES, MUMPS & RUBELLA VAC IJ SOLR
0.5000 mL | Freq: Once | INTRAMUSCULAR | Status: DC
  Filled 2018-12-15: qty 0.5

## 2018-12-15 MED ORDER — SIMETHICONE 80 MG PO CHEW: 80.0000 mg | CHEWABLE_TABLET | ORAL | Status: DC | PRN

## 2018-12-15 MED ORDER — TETANUS-DIPHTH-ACELL PERTUSSIS 5-2.5-18.5 LF-MCG/0.5 IM SUSP: 0.5000 mL | Freq: Once | INTRAMUSCULAR | Status: DC

## 2018-12-15 MED ORDER — SODIUM CHLORIDE 0.9 % IV SOLN: 250.0000 mL | INTRAVENOUS | Status: DC | PRN

## 2018-12-15 MED ORDER — ONDANSETRON HCL 4 MG/2ML IJ SOLN: 4.0000 mg | INTRAMUSCULAR | Status: DC | PRN

## 2018-12-15 MED ORDER — SODIUM CHLORIDE (PF) 0.9 % IJ SOLN
INTRAMUSCULAR | Status: AC
  Filled 2018-12-15: qty 50

## 2018-12-15 MED ORDER — BUPIVACAINE LIPOSOME 1.3 % IJ SUSP
INTRAMUSCULAR | Status: AC
  Filled 2018-12-15: qty 20

## 2018-12-15 MED ORDER — WITCH HAZEL-GLYCERIN EX PADS
1.0000 "application " | MEDICATED_PAD | CUTANEOUS | Status: DC | PRN
  Administered 2018-12-15 – 2018-12-17 (×3): 1 via TOPICAL
  Filled 2018-12-15 (×3): qty 100

## 2018-12-15 MED ORDER — PRENATAL MULTIVITAMIN CH
1.0000 | ORAL_TABLET | Freq: Every day | ORAL | Status: DC
  Administered 2018-12-15 – 2018-12-17 (×3): 1 via ORAL
  Filled 2018-12-15 (×3): qty 1

## 2018-12-15 MED ORDER — ONDANSETRON HCL 4 MG PO TABS: 4.0000 mg | ORAL_TABLET | ORAL | Status: DC | PRN

## 2018-12-15 MED ORDER — OXYCODONE-ACETAMINOPHEN 5-325 MG PO TABS: 1.0000 | ORAL_TABLET | Freq: Four times a day (QID) | ORAL | Status: DC | PRN

## 2018-12-15 MED ORDER — BUPIVACAINE HCL (PF) 0.25 % IJ SOLN
INTRAMUSCULAR | Status: DC | PRN
  Administered 2018-12-15: 5 mL via EPIDURAL

## 2018-12-15 MED ORDER — SENNOSIDES-DOCUSATE SODIUM 8.6-50 MG PO TABS
2.0000 | ORAL_TABLET | ORAL | Status: DC
  Administered 2018-12-15 – 2018-12-17 (×2): 2 via ORAL
  Filled 2018-12-15: qty 2

## 2018-12-15 MED ORDER — ACETAMINOPHEN 325 MG PO TABS
650.0000 mg | ORAL_TABLET | ORAL | Status: DC | PRN
  Administered 2018-12-17: 650 mg via ORAL
  Filled 2018-12-15 (×2): qty 2

## 2018-12-15 MED ORDER — BISACODYL 10 MG RE SUPP
10.0000 mg | Freq: Every day | RECTAL | Status: DC | PRN
  Filled 2018-12-15: qty 1

## 2018-12-15 NOTE — Lactation Note (Signed)
This note was copied from a baby's chart. Lactation Consultation Note  Patient Name: Savannah Mills UMPNT'I Date: 12/15/2018 Reason for consult: Follow-up assessment;Primapara;Term;Other (Comment)(Difficulty getting wide mouth, curls in lower lip & shallow )  LC has frequently assisted mom with positioning with pillow support for comfort and latching [name].  Demonstrated hand expression to entice him to latch.  Alphonzo Cruise does not open mouth very wide to get deep enough latch.  He curls in his lower lip requiring gently pressure on his chin to flip out lower lip.  Towards end of breast feed, he will slip back to tip of nipple getting shallow latch.  When he does this, it looks like he is still sucking on the breast but he really has come off the nipple with pierced lips sucking on his tongue.  Mom is recognizing difference now, and will let me know when she is no longer feeling tugging at the breast.  The ability to get deeper latch has improved some throughout the day with less sucking on his tongue and sucking longer on the breast.  Mom is not real aggressive with holding him near the breast.  Sometimes after he has only taken a few sucks, she reports that he is no longer hungry and has fallen asleep.  After he breast fed for 10 minutes at this feeding, nurse weighed him and temperature was low.  Put him back to the breast skin to skin with mom where he sucked for a few more times before falling asleep again.  Demonstrated how to massage breast and gently stimulate [name] to keep him nutritively sucking at the breast.  Explained feeding cues and encouraged mom to put him to the breast whenever he demonstrates hunger cues.  Reviewed newborn stomach size, supply and demand, normal course of lactation and routine newborn feeding patterns.  Lactation name and number is on white board and encouraged mom to call with any questions, concerns or assistance.  Maternal Data Formula Feeding for Exclusion: No Has  patient been taught Hand Expression?: Yes Does the patient have breastfeeding experience prior to this delivery?: No  Feeding Feeding Type: Breast Fed  LATCH Score Latch: Repeated attempts needed to sustain latch, nipple held in mouth throughout feeding, stimulation needed to elicit sucking reflex.  Audible Swallowing: A few with stimulation  Type of Nipple: Everted at rest and after stimulation  Comfort (Breast/Nipple): Soft / non-tender  Hold (Positioning): Assistance needed to correctly position infant at breast and maintain latch.  LATCH Score: 7  Interventions Interventions: Breast feeding basics reviewed;Assisted with latch;Breast massage;Hand express;Reverse pressure;Breast compression;Adjust position;Support pillows;Position options  Lactation Tools Discussed/Used WIC Program: [employer])   Consult Status Consult Status: Follow-up Follow-up type: Call as needed    Jarold Motto 12/15/2018, 10:11 PM

## 2018-12-15 NOTE — Progress Notes (Signed)
Labor Progress Note  Savannah Mills is a 23 y.o. G1P0 at [redacted]w[redacted]d by LMP admitted for induction of labor due to late term.  Subjective:  Comfortable with epidural, right more numb than left. Feeling only slight rectal pressure but tired. Has been pushing since about 0245.   Objective: BP 122/80   Pulse 95   Temp 98.5 F (36.9 C) (Oral)   Resp 16   Ht 5\' 6"  (1.676 m)   Wt 73.9 kg   LMP 03/03/2018   SpO2 100%   BMI 26.31 kg/m  Notable VS details: reviewed  Fetal Assessment: FHT:  FHR: 140 bpm, variability: moderate,  accelerations:  Present,  decelerations:  Absent  Category/reactivity:  Category I UC:  Q2-64min, Pitocin at 31mu/min SVE:   C/C/+1, caput to +2,  Membrane status: ruptured, scant amount clear fluid around 1400 Amniotic color: clear  Pushed with 2 UCs CNM at bedside, fetal position in direct OP with good fetal descent to +2/+3 with push but back to starting station at end of contraction.  Labs: Lab Results  Component Value Date   WBC 9.8 12/14/2018   HGB 10.9 (L) 12/14/2018   HCT 32.1 (L) 12/14/2018   MCV 84.3 12/14/2018   PLT 174 12/14/2018    Assessment / Plan: Second stage labor at 41+0wks  Labor: s/p 2 doses cytotec, SROM and Pitocin. C/C/+1 at [place], started pushing at approx 0245 with minimal overall descent. Epidural rate decreased to facilitate pt sensation, discussed operative vaginal delivery with vacuum assist but fetal station is too high at this time. WIll continue to push and reassess. Cat I tracing.  Preeclampsia:  no e/o pre-e Fetal Wellbeing:  Category I Pain Control:  Epidural I/D:  n/a Anticipated MOD:  NSVD  Francetta Found, CNM 12/15/2018  4:15 AM

## 2018-12-15 NOTE — Progress Notes (Signed)
Labor Progress Note  Savannah Mills is a 23 y.o. G1P0 at [redacted]w[redacted]d by LMP admitted for induction of labor due to late term.  Subjective:  "Tired", feeling rectal pressure with UCs.   Objective: BP 122/80   Pulse 95   Temp 99.6 F (37.6 C) (Axillary)   Resp 16   Ht 5\' 6"  (1.676 m)   Wt 73.9 kg   LMP 03/03/2018   SpO2 100%   BMI 26.31 kg/m  Notable VS details: reviewed  Fetal Assessment: FHT:  FHR: 150 bpm, variability: moderate,  accelerations:  Abscent,  decelerations:  Absent  Category/reactivity:  Category I UC:  Q2-35min, Pitocin at 76mu/min SVE:   C/C/+2, caput to +3,  Membrane status: ruptured, scant amount clear fluid around 1400 Amniotic color: clear    Labs: Lab Results  Component Value Date   WBC 9.8 12/14/2018   HGB 10.9 (L) 12/14/2018   HCT 32.1 (L) 12/14/2018   MCV 84.3 12/14/2018   PLT 174 12/14/2018    Assessment / Plan: Second stage labor at 41+0wks  Labor: s/p 2 doses cytotec, SROM and Pitocin. C/C/+1 at [place], started pushing at approx 0245 with minimal overall descent. Epidural rate decreased at 0415.  -  Now fetus in ROA position by exam, some descent with push, poor maternal efforts despite multiple position changes.  - Dr Leafy Ro called and notified of prolonged second stage, now at approx 3hrs pushing with at least 1 break of about 15-3min.   Preeclampsia:  no e/o pre-e Fetal Wellbeing:  Category I Pain Control:  Epidural I/D:  n/a Anticipated MOD:  NSVD  Francetta Found, CNM 12/15/2018  6:05 AM

## 2018-12-15 NOTE — Plan of Care (Signed)
Pt. V/o.

## 2018-12-16 LAB — CBC
HCT: 27 % — ABNORMAL LOW (ref 36.0–46.0)
Hemoglobin: 9.1 g/dL — ABNORMAL LOW (ref 12.0–15.0)
MCH: 28.7 pg (ref 26.0–34.0)
MCHC: 33.7 g/dL (ref 30.0–36.0)
MCV: 85.2 fL (ref 80.0–100.0)
Platelets: 142 10*3/uL — ABNORMAL LOW (ref 150–400)
RBC: 3.17 MIL/uL — ABNORMAL LOW (ref 3.87–5.11)
RDW: 15.9 % — ABNORMAL HIGH (ref 11.5–15.5)
WBC: 14.9 10*3/uL — ABNORMAL HIGH (ref 4.0–10.5)
nRBC: 0 % (ref 0.0–0.2)

## 2018-12-16 NOTE — Progress Notes (Signed)
Pt. Has a small amount of urine onto Peri Pad prior to void and she also has a small amount of urine out after she voids. Pt. Has significant Peri area swelling. Peri bottle and Ice Packs after every void. Will cont. To watch closely.

## 2018-12-16 NOTE — Progress Notes (Signed)
Dr. Ronelle Nigh notified of Pt. Urinary Incontinence and he requested Nurse Anesthetist be notified when she makes Patient rounds this morning.

## 2018-12-16 NOTE — Progress Notes (Signed)
Pt. Was up to bathroom, stated she had some incontinence before she voided and after she voided she continued to "dribble". I scanned her bladder and there was 111cc of urine in the bladder post void. R. McVey CNM notified and no new orders received. Pt.'s Fundus is firm at U/E and midline with small amount of Lochia. Her labia is significantly swollen without evidence of  Hematoma. Will cont. To follow closely.

## 2018-12-16 NOTE — Progress Notes (Signed)
Pt. Stated she needed to void. Assisted up to [facility] and a moderate amount of urine noted on Peri Pad. Pt. Then voided 100cc and had small amount of urine that dribbled on to Peri Pad post void. Fundus is firm at U/E, small amount of Lochia. Labia minora and majora are edematous; denies pain with palpation to these areas and no visible signs of Hematoma. Pt. Denies pain except when she moves and the peri pad puts "Pressure" on her perineum.

## 2018-12-16 NOTE — Lactation Note (Signed)
This note was copied from a baby's chart. Lactation Consultation Note  Patient Name: Savannah Mills ENIDP'O Date: 12/16/2018 Reason for consult: Follow-up assessment;Mother's request;Primapara;Term;Hyperbilirubinemia;Other (Comment)([name] on bililites & mom using NS & supplementing with form)  Mom has not been receptive to putting [name] to the breast today with or without nipple shield.  She reports that she cannot get in comfortable position to breast feed or pump d/t large hemorrhoids.  Mom also is complaining of sore nipples now.  I have been able to get her to pump twice today after her flange size was increased to #27 and coconut oil was applied to flanges.  Comfort gels given after this pumping and mom reports how comfortable they feel.  It was difficult to get her in comfortable position and she only agreed to pump on one side at a time.  Her expressed colostrum is still rust colored, but less than first pumping.  She is giving her expressed milk via syringe or bottle and [name] is tolerating without vomiting or stomach upset, but does spit some occasionally when formula given.  Reassured mom that it was okay to continue to give her expressed rust colored milk to her baby.  Reviewed pumping, storage, collection, labeling, cleaning and handling of expressed milk. Reviewed supply and demand and need to continue to stimulate breasts either by pumping or putting [name] to the breast to bring in mature milk and ensure a plentiful milk supply.  Mom has a Dysart at home through her [employer].  Encouraged mom to call when she needed assistance.     Maternal Data Formula Feeding for Exclusion: No Has patient been taught Hand Expression?: Yes Does the patient have breastfeeding experience prior to this delivery?: No(Gr1)  Feeding    LATCH Score                   Interventions Interventions: Support pillows;Position options;Expressed milk;Coconut oil;Comfort  gels;DEBP  Lactation Tools Discussed/Used Tools: Pump;Coconut oil;Comfort gels Breast pump type: Double-Electric Breast Pump(But only pumping one side at a time b/c cannot get comfortab) WIC Program: [employer]) Pump Review: Setup, frequency, and cleaning;Milk Storage;Other (comment) Initiated by:: D.Barton,RN Date initiated:: 12/15/18   Consult Status Consult Status: Follow-up Follow-up type: Call as needed    Jarold Motto 12/16/2018, 9:14 PM

## 2018-12-16 NOTE — Progress Notes (Signed)
Post Partum Day PPD#1 s/p forcep delivery . Sore .  Subjective: Sore . Taking Oxycodone for pain relief .   Objective: Blood pressure 111/77, pulse 82, temperature 98.6 F (37 C), temperature source Oral, resp. rate 20, height 5\' 6"  (1.676 m), weight 73.9 kg, last menstrual period 03/03/2018, SpO2 100 %, unknown if currently breastfeeding.  Physical Exam:  General: alert and cooperative Lochia: appropriate Uterine Fundus: firm Incision: n/a  DVT Evaluation: No evidence of DVT seen on physical exam.  Recent Labs    12/14/18 0610 12/16/18 0447  HGB 10.9* 9.1*  HCT 32.1* 27.0*    Assessment/Plan: Plan for discharge tomorrow   LOS: 2 days   Savannah Mills 12/16/2018, 10:21 AM

## 2018-12-16 NOTE — Anesthesia Postprocedure Evaluation (Signed)
Anesthesia Post Note  Patient: Savannah Mills  Procedure(s) Performed: AN AD [place]  Patient location during evaluation: Mother Baby Anesthesia Type: Epidural Level of consciousness: awake and alert Pain management: pain level controlled Vital Signs Assessment: post-procedure vital signs reviewed and stable Respiratory status: spontaneous breathing, nonlabored ventilation and respiratory function stable Cardiovascular status: stable Postop Assessment: no headache, no backache and epidural receding Anesthetic complications: no Comments: Pt complains of incontinence     Last Vitals:  Vitals:   12/15/18 2302 12/16/18 0328  BP: 128/85 111/77  Pulse: 86 82  Resp: 20 20  Temp: 36.9 C 37 C  SpO2: 100% 100%    Last Pain:  Vitals:   12/16/18 0328  TempSrc: Oral  PainSc: 0-No pain                 Estill Batten

## 2018-12-17 ENCOUNTER — Encounter: Payer: Self-pay | Admitting: Certified Registered"

## 2018-12-17 MED ORDER — SENNOSIDES-DOCUSATE SODIUM 8.6-50 MG PO TABS: 2.0000 | ORAL_TABLET | Freq: Every evening | ORAL | 1 refills | Status: DC | PRN

## 2018-12-17 MED ORDER — FERROUS SULFATE 325 (65 FE) MG PO TABS
325.0000 mg | ORAL_TABLET | Freq: Two times a day (BID) | ORAL | Status: DC
  Administered 2018-12-17: 325 mg via ORAL
  Filled 2018-12-17: qty 1

## 2018-12-17 MED ORDER — IBUPROFEN 600 MG PO TABS: 600.0000 mg | ORAL_TABLET | Freq: Four times a day (QID) | ORAL | 0 refills | Status: DC

## 2018-12-17 MED ORDER — FERROUS SULFATE 325 (65 FE) MG PO TABS: 325.0000 mg | ORAL_TABLET | Freq: Two times a day (BID) | ORAL | 0 refills | Status: DC

## 2018-12-17 MED ORDER — OXYCODONE HCL 5 MG PO TABS: 5.0000 mg | ORAL_TABLET | ORAL | 0 refills | Status: DC | PRN

## 2018-12-17 MED ORDER — VARICELLA VIRUS VACCINE LIVE 1350 PFU/0.5ML IJ SUSR
0.5000 mL | Freq: Once | INTRAMUSCULAR | Status: DC
  Filled 2018-12-17: qty 0.5

## 2018-12-17 MED ORDER — ACETAMINOPHEN 500 MG PO TABS: 1000.0000 mg | ORAL_TABLET | Freq: Four times a day (QID) | ORAL | 0 refills | Status: AC | PRN

## 2018-12-17 MED ORDER — BENZOCAINE-MENTHOL 20-0.5 % EX AERO: 1.0000 "application " | INHALATION_SPRAY | CUTANEOUS | 0 refills | Status: DC | PRN

## 2018-12-17 NOTE — Progress Notes (Signed)
Discharge order received from doctor. Varicella, MMR, and flu vaccines offered at discharge. Patient refused varicella, MMR, and flu vaccines.  Reviewed discharge instructions and prescriptions with patient and answered all questions. Follow up appointment instructions given. Patient verbalized understanding. ID bands checked. Patient discharged home with infant via wheelchair by nursing/auxillary.   Hilbert Bible, RN

## 2018-12-17 NOTE — Discharge Summary (Addendum)
Obstetric Discharge Summary   Patient Name: Savannah Mills DOB: Jun 13, 1995 MRN: [id number]  Date of Admission: 12/14/2018 Date of Delivery: 12/15/2018 Delivered by: Dr. Christeen Douglas and Dr. Thomasene Mohair Date of Discharge: 12/17/2018  Primary OB: [facility]  KYH:[id number] last menstrual period was 03/03/2018. EDC Estimated Date of Delivery: 12/08/18 Gestational Age at Delivery: [redacted]w[redacted]d   Antepartum complications:  1. Hemorrhoids 2. 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 Varicella non-immune 4. Rubella non-immune 5. Anemia on iron supplementation 6. Fetal pyelectasis, right renal pelvis=0.63cm left renal pelvis=0.60cm at 32w  Admitting Diagnosis: Planned induction of labor  Secondary Diagnoses: Patient Active Problem List   Diagnosis Date Noted  . Labor and delivery indication for care or intervention 12/14/2018  . Mastalgia 07/06/2016    Induction: Pitocin and Cytotec Complications: Prolonged second stage Intrapartum complications/course: see delivery note Delivery Type: forceps, outlet Anesthesia: epidural Placenta: spontaneous Laceration: 2nd degree vaginal Episiotomy: none  Newborn Data: Live born female "[name]" Birth Weight: 8 lb 11.7 oz (3960 g) APGAR: 8, 9  Newborn Delivery   Birth date/time: 12/15/2018 09:12:00 Delivery type: Vaginal, Forceps      Postpartum Course  Patient had an uncomplicated postpartum course.  By time of discharge on PPD#2, her pain was controlled on oral pain medications; she had appropriate lochia and was ambulating, voiding without difficulty and tolerating regular diet.  She was deemed stable for discharge to home.       Labs: CBC Latest Ref Rng & Units 12/16/2018 12/14/2018 01/27/2014  WBC 4.0 - 10.5 K/uL 14.9(H) 9.8 11.3(H)  Hemoglobin 12.0 - 15.0 g/dL 3.1(D) 10.9(L) 12.7  Hematocrit 36.0 - 46.0 % 27.0(L) 32.1(L) 37.8  Platelets 150 - 400 K/uL 142(L) 174 208   AB POS  Physical  exam:  BP 103/69 (BP Location: Right Arm)   Pulse 94   Temp 98.1 F (36.7 C) (Oral)   Resp 20   Ht 5\' 6"  (1.676 m)   Wt 73.9 kg   LMP 03/03/2018   SpO2 97%   Breastfeeding Unknown   BMI 26.31 kg/m  General: alert and no distress Pulm: normal respiratory effort Lochia: appropriate Abdomen: soft, NT Uterine Fundus: firm, below umbilicus Extremities: No evidence of DVT seen on physical exam. No lower extremity edema.  Disposition: stable, discharge to home Baby Feeding: breastmilk& formula Baby Disposition: continued admission for bililights  Contraception: Paragard IUD  Prenatal Labs:  Blood type/Rh AB+  Antibody screen neg  Rubella Non-immune  Varicella Non-immune  RPR NR  HBsAg Neg  HIV NR  GC neg  Chlamydia neg  Genetic screening negative  1 hour GTT 123  3 hour GTT n/a  GBS negative    Rh Immune globulin given: n/a Rubella vaccine given: offered prior to discharge Varicella vaccine given: offered prior to discharge Tdap vaccine given in AP or PP setting: AP 09/18/2018  Plan:  09/20/2018 was discharged to home in good condition. Follow-up appointment at [facility] with delivery provider in 6 weeks  Discharge Instructions: Per After Visit Summary. Activity: Advance as tolerated. Pelvic rest for 6 weeks.   Diet: Regular Discharge Medications: Allergies as of 12/17/2018      Reactions   Cinnamon Anaphylaxis      Medication List    STOP taking these medications   valACYclovir 500 MG tablet Commonly known as: VALTREX     TAKE these medications   acetaminophen 500 MG tablet Commonly known as: TYLENOL Take 2 tablets (1,000  mg total) by mouth every 6 (six) hours as needed for mild pain or moderate pain.   benzocaine-Menthol 20-0.5 % Aero Commonly known as: DERMOPLAST Apply 1 application topically as needed for irritation (perineal discomfort).   cetirizine 5 MG tablet Commonly known as: ZYRTEC Take 5 mg by mouth daily.    ferrous sulfate 325 (65 FE) MG tablet Take 1 tablet (325 mg total) by mouth 2 (two) times daily with a meal. What changed: when to take this   ibuprofen 600 MG tablet Commonly known as: ADVIL Take 1 tablet (600 mg total) by mouth every 6 (six) hours.   oxyCODONE 5 MG immediate release tablet Commonly known as: Oxy IR/ROXICODONE Take 1 tablet (5 mg total) by mouth every 4 (four) hours as needed (pain scale 4-7).   prenatal multivitamin Tabs tablet Take 1 tablet by mouth daily at 12 noon.   senna-docusate 8.6-50 MG tablet Commonly known as: Senokot-S Take 2 tablets by mouth at bedtime as needed for mild constipation or moderate constipation.      Outpatient follow up:  Follow-up Information    Benjaman Kindler, MD. Schedule an appointment as soon as possible for a visit in 6 week(s).   Specialty: Obstetrics and Gynecology Why: For routine postpartum visit Contact information: [address] [place] [address] [phone]            Signed:  Lisette Grinder, CNM 12/17/2018 8:45 AM

## 2018-12-17 NOTE — Discharge Instructions (Signed)
Please call your doctor or return to the ER if you experience any chest pains, shortness of breath, dizziness, visual changes, severe headache (unrelieved by pain meds), fever greater than 101, any heavy bleeding (saturating more than 1 pad per hour), large clots, or foul smelling discharge, any worsening abdominal pain and cramping that is not controlled by pain medication, any calf/leg pain or redness, any breast concerns (redness/pain), or any signs of postpartum depression. No tampons, enemas, douches, or sexual intercourse for 6 weeks. Also avoid tub baths, hot tubs, or swimming for 6 weeks.        After Your Delivery Discharge Instructions   Postpartum: Care Instructions  After childbirth (postpartum period), your body goes through many changes. Some of these changes happen over several weeks. In the hours after delivery, your body will begin to recover from childbirth while it prepares to breastfeed your newborn. You may feel emotional during this time. Your hormones can shift your mood without warning for no clear reason.  In the first couple of weeks after childbirth, many women have emotions that change from happy to sad. You may find it hard to sleep. You may cry a lot. This is called the "baby blues." These overwhelming emotions often go away within a couple of days or weeks. But it's important to discuss your feelings with your doctor.  You should call your care provider if you have unrelieved feelings of:  Inability to cope  Sadness  Anxiety  Lack of interest in baby  Insomnia  Crying  It is easy to get too tired and overwhelmed during the first weeks after childbirth. Don't try to do too much. Get rest whenever you can, accept help from others, and eat well and drink plenty of fluids.  About 4 to 6 weeks after your baby's birth, you will have a follow-up visit with your care provider. This visit is your time to talk to your provider about anything you are concerned or  curious about.  Follow-up care is a key part of your treatment and safety. Be sure to make and go to all appointments, and call your doctor if you are having problems. It's also a good idea to know your test results and keep a list of the medicines you take.  How can you care for yourself at home?  Sleep or rest when your baby sleeps.  Get help with household chores from family or friends, if you can. Do not try to do it all yourself.  If you have hemorrhoids or swelling or pain around the opening of your vagina, try using cold and heat. You can put ice or a cold pack on the area for 10 to 20 minutes at a time. Put a thin cloth between the ice and your skin. Also try sitting in a few inches of warm water (sitz bath) 3 times a day and after bowel movements.  Take pain medicines exactly as directed.  If the provider gave you a prescription medicine for pain, take it as prescribed.  If you do not have a prescription and need something over the counter, you can take:  Ibuprofen (Motrin, Advil) up to 600mg every 6 hours as needed for pain  Acetaminophen (Tylenol) up to 650mg every 4 hours as needed for pain  Some people find it helpful to alternate between these two medications.   No driving for 1-2 weeks or while taking pain medications.   Eat more fiber to avoid constipation. Include foods such as whole-grain breads   and cereals, raw vegetables, raw and dried fruits, and beans.  Drink plenty of fluids, enough so that your urine is light yellow or clear like water. If you have kidney, heart, or liver disease and have to limit fluids, talk with your doctor before you increase the amount of fluids you drink.  Do not put anything in the vagina for 6 weeks. This means no sex, no tampons, no douching, and no enemas.  If you have stitches, keep the area clean by pouring or spraying warm water over the area outside your vagina and anus after you use the toilet.  No strenuous activity or heavy  lifting for 6 weeks   No tub baths; showers only  Continue prenatal vitamin and iron.  If breastfeeding:  Increase calories and fluids while breastfeeding.  You may have a slight fever when your milk comes in, but it should go away on its own. If it does not, and rises above 101.0 please call the doctor.  For breastfeeding concerns, the lactation consultant can be reached at [phone].  For concerns about your baby, please call your pediatrician.   Keep a list of questions to bring to your postpartum visit. Your questions might be about:  Changes in your breasts, such as lumps or soreness.  When to expect your menstrual period to start again.  What form of birth control is best for you.  Weight you have put on during the pregnancy.  Exercise options.  What foods and drinks are best for you, especially if you are breastfeeding.  Problems you might be having with breastfeeding.  When you can have sex. Some women may want to talk about lubricants for the vagina.  Any feelings of sadness or restlessness that you are having.   When should you call for help?  Call 911 anytime you think you may need emergency care. For example, call if:  You have thoughts of harming yourself, your baby, or another person.  You passed out (lost consciousness).  Call the office at [phone] or seek immediate medical care if:  If you have heavy bleeding such that you are soaking 1 pad in an hour for 2 hours  You are dizzy or lightheaded, or you feel like you may faint.  You have a fever; a temperature of 101.0 F or greater  Chills  Difficulty urinating  Headache unrelieved by "pain meds"   Visual changes  Pain in the right side of your belly near your ribs  Breasts reddened, hard, hot to the touch or any other breast concerns  Nipple discharge which is foul-smelling or contains pus   Increased pain at the site of the tear   New pain unrelieved with recommended  over-the-counter dosages  Difficulty breathing with or without chest pain   New leg pain, swelling, or redness, especially if it is only on one leg  Any other concerns  Watch closely for changes in your health, and be sure to contact your provider if:  You have new or worse vaginal discharge.  You feel sad or depressed.  You are having problems with your breasts or breastfeeding.    

## 2018-12-17 NOTE — Progress Notes (Signed)
Pt took a shower around 2200 and offered a sitz bath to pt. Brought in extra supplies to care for buttocks and hemorrhoids. She states she is feeling a lot better after the shower. Confident "making the sandwich" of pad, ice packs, tucks, spray, and cream.   Pt states she is still having some urinary incontinence but not as bad as yesterday. Will pass on to day shift and encourage pt to mention to provider in rounds.  Pt will need stool softeners ordered upon leaving hospital.

## 2018-12-17 NOTE — Clinical Social Work Maternal (Signed)
  CLINICAL SOCIAL WORK MATERNAL/CHILD NOTE  Patient Details  Name: Savannah Mills MRN: [id number] Date of Birth: 08-15-1995  Date:  12/17/2018  Clinical Social Worker Initiating Note:  Annamaria Boots Date/Time: Initiated:  12/17/18/1140     Child's Name:  West Pugh   Biological Parents:  Mother, Father   Need for Interpreter:  None   Reason for Referral:  [place] Concerns(Edinburgh score of 10)   Address:  [address] [place] [address]    Phone number:  [phone] (home)     Additional phone number:   Household Members/Support Persons (HM/SP):       HM/SP Name Relationship DOB or Age  HM/SP -1        HM/SP -2        HM/SP -3        HM/SP -4        HM/SP -5        HM/SP -6        HM/SP -7        HM/SP -8          Natural Supports (not living in the home):  Spouse/significant other   Professional Supports: None   Employment: Unemployed   Type of Work:     Education:  Programmer, systems   Homebound arranged:    Museum/gallery curator Resources:  Multimedia programmer   Other Resources:      Cultural/Religious Considerations Which May Impact Care:    Strengths:  Ability to meet basic needs , Home prepared for child , Understanding of illness, Pediatrician chosen   Psychotropic Medications:         Pediatrician:    [employer]  Pediatrician List:   [place]      Pediatrician Fax Number:    Risk Factors/Current Problems:  Mental Health Concerns    Cognitive State:  Alert , Goal Oriented    Mood/Affect:  Bright , Happy    CSW Assessment: CSW consulted for Edinburgh score of 10. CSW met with patient and discussed mental health concerns. Patient reports that she lives with her husband and has all the necessary items for baby at home. Patient reports that she plans to use formula for baby and has everything  she needs. Patient reports that she has a history of anxiety and was on medication several years ago. Patient states that she has not been on medication in a few years and does not feel a need at this time. CSW explained post partum depression and signs to look for. Patient and husband state understanding of this. Patient does not have a mental health provider but feels comfortable reaching out to her OB/GYN or PCP if needed. Patient has no other concerns and is bonding well with baby.   CSW Plan/Description:  No Further Intervention Required/No Barriers to Discharge    Annamaria Boots, Latanya Presser 12/17/2018, 12:01 PM

## 2018-12-17 NOTE — Lactation Note (Signed)
This note was copied from a baby's chart. Lactation Consultation Note  Patient Name: Savannah Mills [id number] Date: 12/17/2018 Reason for consult: Follow-up assessment  Mom has been formula feeding since yesterday, with an inconsistent pumping routine, and no attempts at putting baby to the breast. Mom has expressed to RN that she may be considering only formula feeding. [place] spoke with mom, mom hesitant to answer if she wants to continue with breastfeeding efforts. LC provided reassurance of offering support regardless of decision. LC did provided education and guidance on continuing breastfeeding efforts through pumping until decision is made. LC explained milk supply/demand, with building milk supply easier at this time than in later weeks without continual stimulation. Mom and dad both understand, but mom continues to be unsure. LC did give guidance on formula preparation instructions, mixing, cooling, and batch preparation. Reviewed infant feeding position and paced-bottle feeding to prevent over feeding. Reviewed early hunger cues and signs of fullness.  Encouraged to continue tracking wet/stool diapers, and skin to skin. [place] left name and number on whiteboard and encourage mom to call out with any questions or concerns before discharge. Let parents know of lactation support post discharge through outpatient lactation consultation services, and encouraged the breastfeeding and mom's talk support groups.  Maternal Data Formula Feeding for Exclusion: Yes Reason for exclusion: Mother's choice to formula and breast feed on admission Has patient been taught Hand Expression?: Yes Does the patient have breastfeeding experience prior to this delivery?: No  Feeding Feeding Type: Bottle Fed - Formula Nipple Type: Slow - flow  LATCH Score                   Interventions Interventions: Breast feeding basics reviewed  Lactation Tools Discussed/Used     Consult Status Consult  Status: Complete Date: 12/17/18 Follow-up type: Call as needed    Lavonia Drafts 12/17/2018, 10:39 AM

## 2018-12-17 NOTE — Anesthesia Postprocedure Evaluation (Signed)
Anesthesia Post Note  Patient: Savannah Mills  Procedure(s) Performed: AN AD [place]  Patient location during evaluation: Mother Baby Anesthesia Type: Epidural Level of consciousness: awake and alert Pain management: pain level controlled Vital Signs Assessment: post-procedure vital signs reviewed and stable Respiratory status: spontaneous breathing, nonlabored ventilation and respiratory function stable Cardiovascular status: stable Postop Assessment: no headache, no backache and epidural receding Anesthetic complications: no     Last Vitals:  Vitals:   12/17/18 0830 12/17/18 1055  BP: 110/65   Pulse: 77   Resp: 18   Temp: 36.9 C 37.1 C  SpO2: 98%     Last Pain:  Vitals:   12/17/18 0850  TempSrc:   PainSc: 2                  Jerrye Noble

## 2019-02-11 ENCOUNTER — Other Ambulatory Visit: Payer: Self-pay

## 2019-02-11 ENCOUNTER — Ambulatory Visit: Payer: Managed Care, Other (non HMO) | Attending: Obstetrics and Gynecology

## 2019-02-11 DIAGNOSIS — R293 Abnormal posture: Secondary | ICD-10-CM | POA: Diagnosis present

## 2019-02-11 DIAGNOSIS — M62838 Other muscle spasm: Secondary | ICD-10-CM | POA: Diagnosis not present

## 2019-02-11 DIAGNOSIS — M533 Sacrococcygeal disorders, not elsewhere classified: Secondary | ICD-10-CM

## 2019-02-11 NOTE — Therapy (Signed)
[facility] MAIN [facility] SERVICES [address] [place], [place], [address] Phone: [phone]   Fax:  [phone]  Physical Therapy Evaluation  The patient has been informed of current processes in place at Outpatient Rehab to protect patients from Covid-19 exposure including social distancing, schedule modifications, and new cleaning procedures. After discussing their particular risk with a therapist based on the patient's personal risk factors, the patient has decided to proceed with in-person therapy.   Patient Details  Name: Savannah Mills MRN: [id number] Date of Birth: Oct 18, 1995 No data recorded  Encounter Date: 02/11/2019  PT End of Session - 02/11/19 1346    Visit Number  1    Number of Visits  10    Date for PT Re-Evaluation  04/22/19    Authorization Type  cigna    Authorization Time Period  from 02/11/2019 to 04/22/2019    Authorization - Visit Number  1    Authorization - Number of Visits  10    PT Start Time  1105    PT Stop Time  1205    PT Time Calculation (min)  60 min       Past Medical History:  Diagnosis Date  . Anemia   . Anxiety   . Bilateral breast cysts   . IBS (irritable bowel syndrome)     Past Surgical History:  Procedure Laterality Date  . COLONOSCOPY  6 years  . ingrown toenail excision    . MINOR HEMORRHOIDECTOMY    . UPPER GI ENDOSCOPY  6 years ago  . WISDOM TOOTH EXTRACTION      There were no vitals filed for this visit.       [facility] PT Assessment - 02/11/19 0001      Balance Screen   Has the patient fallen in the past 6 months  No      Prior Function   Level of Independence  Independent    Vocation  Other (comment)    Secretary/administrator   Overall Cognitive Status  Within Functional Limits for tasks assessed         Pelvic Floor Physical Therapy Evaluation and Assessment  SCREENING  Falls in last 6 mo: no   Patient's communication  preference:   Red Flags:  Have you had any night sweats? no Unexplained weight loss? no Saddle anesthesia? no Unexplained changes in bowel or bladder habits? no  SUBJECTIVE  Patient reports: Pushed for 7 hours, 3 to get him turned, 4 because his head was stuck. Was delivered by forceps.   LBP with bending forward to put baby in/out of pack and play and laying flat on her back as well as sleeping on her side. Notices knee achiness when she is standing from sitting for prolonged periods Had sciatic pain throughout her pregnancy.  Cheer-leaded for most of her life.  Thinks she has some scoliosis. 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  Recent Procedures/Tests/Findings:  none  Obstetrical History: G1P1001  Gynecological History: Had a uterine infection postnatally. Feel and see prolapse after BM or being on her feet for >1 hr.   Urinary History: Has SUI every time, some urgency with limited leakage. Could delay ~ 10 min.  For emptying  Gastrointestinal History: Having multiple BM's daily but  they are incomplete and stool scale 1-2  Sexual activity/pain: Has h/o pain with intercourse, using lidocaine ointment externally over the last year to help. Has not attempted intercourse since delivery.   Location of pain: LBP  Current pain:  0/10  Max pain:  8/10 Least pain:  0/10 Nature of pain: sharp  Patient Goals: Having more control over her bladder, not have pain or pressure with daily activities and be able to have intercourse without pain.   OBJECTIVE  Posture/Observations:  Sitting: posterior  Pelvic tilt, arms crossed Standing: L ilium high, R leg>L genu recurvatum, RLE ER'd, R PISI high Supine: R ASIS low  Palpation/Segmental Motion/Joint Play: C2 R deviation of spinous process  and prominent. Decreased mobility and pain at mid-to-upper thoracic, T-L junction and L-S junction is mobile but painful   TTP to R>L lumbar multifidus, B Glute Med, OI, adductors and hip-flexors  Special tests:   Scoliosis: R lumbar (3 deg), L thoracic (2 deg)  Supine to long-sit: RLE long in supine and sit  Range of Motion/Flexibilty:  Spine: R SB limited compared to L, L rotation more limited than R (~ 25%), ~ 6-8 in. From floor with forward bend, unable to achieve long sit with neutral pelvis. Hips:   Strength/MMT: Deferred to follow-up LE MMT  LE MMT Left Right  Hip flex:  (L2) /5 /5  Hip ext: /5 /5  Hip abd: /5 /5  Hip add: /5 /5  Hip IR /5 /5  Hip ER /5 /5     Abdominal:  Palpation: TTP to B Psoas and Iliacus Diastasis: 3 fingers at rest, narrows with crunch, does not narrow with leg raise  Pelvic Floor External Exam: Deferred  Introitus Appears:  Skin integrity:  Palpation: Cough: Prolapse visible?: Scar mobility:  Internal Vaginal Exam: Strength (PERF):  Symmetry: Palpation: Prolapse:   Internal Rectal Exam: Strength (PERF): Symmetry: Palpation: Prolapse:   Gait Analysis: Deferred to follow up  Pelvic Floor Outcome Measures: NIH-CPSI: 28/43 (65%), PFDI: 103/300, 86/300, PFIQ: 86/300  INTERVENTIONS THIS SESSION: Self-care: Educated on the structure and function of the pelvic floor in relation to their symptoms as well as the POC, and initial HEP in order to set patient expectations and understanding from which we will build on in the future sessions. Theract: Educated on diaphragmatic breathing, soda-can theory, and sit-to stand to decrease prolapse and breathing dysfunction to set the stage for further interventions and decrease POP, UUI, and SUI Sx.   Total time: 60 min.          Objective measurements completed on examination: See above findings.                PT Short Term Goals - 02/11/19 1504      PT SHORT TERM GOAL  #1   Title  Patient will demonstrate a coordinated contraction, relaxation, and bulge of the pelvic floor muscles to demonstrate functional recruitment and motion and allow for further strengthening.    Baseline  Pt. history suggests PFM spasms and poor cordination    Time  5    Period  Weeks    Status  New    Target Date  03/18/19      PT SHORT TERM GOAL #2   Title  Patient will demonstrate improved pelvic alignment and balance of musculature surrounding the pelvis to facilitate decreased PFM spasms and decrease pelvic pain.    Baseline  L up-slip vs. RLE long, spasms surrounding the pelvis.    Time  5    Period  Weeks    Status  New    Target Date  03/18/19      PT SHORT TERM GOAL #3   Title  Patient will demonstrate HEP x1 in the clinic to demonstrate understanding and proper form to allow for further improvement.    Baseline  Pt. lacks knowledge of therapeutic exercises that can decrease symptoms.    Time  5    Period  Weeks    Target Date  03/18/19        PT Long Term Goals - 02/11/19 1550      PT LONG TERM GOAL #1   Title  Patient will report no episodes of SUI/UUI over the course of the prior two weeks to demonstrate improved functional ability.    Baseline  pt. demonstrates SUI with every cough/sneeze etc, and UUI occasionally with leakage if trying to delay> 10 min.    Time  10    Period  Weeks    Status  New    Target Date  04/22/19      PT LONG TERM GOAL #2   Title  Patient will report no pain with intercourse to demonstrate improved functional ability.    Baseline  Hx. of pain greatest with initial penetration before pregnancy that she required lidocaine cream for.    Time  10    Period  Weeks    Status  New    Target Date  04/22/19      PT LONG TERM GOAL #3   Title  Patient will score at or below 58/300 on the PFDI and 41/300 on the PFIQ to demonstrate a clinically meaningful decrease in disability and distress due to pelvic floor dysfunction.    Baseline   PFDI: 103/300, PFIQ:86/300    Time  10    Period  Weeks    Status  New    Target Date  04/22/19      PT LONG TERM GOAL #4   Title  Patient will score less than or equal to 20% on the Female NIH-CPSI to demonstrate a reduction in pain, urinary symptoms, and an improved quality of life.    Baseline  Female NIH-CPSI: 28/43 (65%)    Time  10    Period  Weeks    Status  New    Target Date  04/22/19      PT LONG TERM GOAL #5   Title  Patient will report having BM's at least every-other day with consistency between [facility] stool scale 3-5 over the prior week to demonstrate decreased constipation.    Baseline  Pt. is having multiple type 1-2 BM's daily    Time  10    Period  Weeks    Status  New    Target Date  04/22/19             Plan - 02/11/19 1348    Clinical Impression Statement  Pt. is a 23 y/o female who presents today with cheif c/o pelvic pain, dyspareunia, pelvic organ prolapse, and mixed incontinence in the setting of being 2 months postpartum following long delivery. Her PMH is significant for minor scoliosis ,anxiety, and IBS. Her clinical assessment revealed a L up-slip vs. RLE long, genu varus, minor R lumbar, L thoracic scoliosis, decreased spinal and sacral mobility as well as pt. history suggesting PFM  spasm and POP. She will benefit from skilled pelvic PT to address the noted deficits as well as to continue to  assess for and address any other potential causes of Sx.    Personal Factors and Comorbidities  Comorbidity 3+    Comorbidities  IBS, Anxiety, Scoliosis    Examination-Activity Limitations  Squat;Stairs;Lift;Bend;Toileting;Caring for Others;Continence;Sleep    Examination-Participation Restrictions  Interpersonal Relationship;Shop;Yard Work;Cleaning    Stability/Clinical Decision Making  Evolving/Moderate complexity    Clinical Decision Making  Moderate    Rehab Potential  Excellent    PT Frequency  1x / week    PT Duration  Other (comment)   10 weeks   PT  Treatment/Interventions  ADLs/Self Care Home Management;Biofeedback;Moist Heat;Electrical Stimulation;Gait training;Therapeutic exercise;Therapeutic activities;Functional mobility training;Neuromuscular re-education;Patient/family education;Manual techniques;Passive range of motion;Dry needling;Taping;Joint Manipulations;Spinal Manipulations    PT Next Visit Plan  pelvic alignment, measure leg-length and give heel-lift PRN internal assessment when appropriate.    PT Home Exercise Plan  breathing, soda-can and sit-to-stand.    Consulted and Agree with Plan of Care  Patient       Patient will benefit from skilled therapeutic intervention in order to improve the following deficits and impairments:  Decreased endurance, Difficulty walking, Improper body mechanics, Decreased activity tolerance, Decreased coordination, Decreased strength, Increased fascial restricitons, Impaired flexibility, Postural dysfunction, Pain  Visit Diagnosis: Other muscle spasm  Abnormal posture  Sacrococcygeal disorders, not elsewhere classified     Problem List Patient Active Problem List   Diagnosis Date Noted  . Labor and delivery indication for care or intervention 12/14/2018  . Mastalgia 07/06/2016   Cleophus MoltKeeli T. Gailes DPT, ATC Cleophus MoltKeeli T Gailes 02/11/2019, 4:59 PM  [facility] [address] [place], [place], [id number] Phone: [phone]   Fax:  [phone]  Name: Savannah Mills MRN: [id number] Date of Birth: [birth year]

## 2019-02-11 NOTE — Patient Instructions (Signed)
Stabilization: Diaphragmatic Breathing    Lie with knees bent, feet flat. Place one hand on stomach, other on chest. Breathe deeply through nose, lifting belly hand without any motion of hand on chest. Practice for 5 min. Per night, and whenever you feel stressed or remember to throughout the day.     EXhale on EXertion!!!!! (pushing, pulling, lifting, standing, etc.)  Whenever you exert force you need to exhale to allow the pressure to escape out of your vocal cords rather than be pressed down through your pelvic floor. Exhaling also helps engage your deep-core muscles to protect your back and pelvic floor.    Sit, feet flat, scoot forward to the edge of the chair. Inhale as you bend forward at hips, begin to exhale just before and while you stand, contracting the glutes, lower tummy muscles and pelvic floor as if stopping urination as you stand up.   * Do this every time you sit or stand! If you catch yourself doing it "wrong, re-set and do it again so it can become habit!

## 2019-02-17 ENCOUNTER — Ambulatory Visit: Payer: Managed Care, Other (non HMO)

## 2019-02-17 ENCOUNTER — Other Ambulatory Visit: Payer: Self-pay

## 2019-02-17 DIAGNOSIS — M62838 Other muscle spasm: Secondary | ICD-10-CM

## 2019-02-17 DIAGNOSIS — M533 Sacrococcygeal disorders, not elsewhere classified: Secondary | ICD-10-CM

## 2019-02-17 DIAGNOSIS — R293 Abnormal posture: Secondary | ICD-10-CM

## 2019-02-17 NOTE — Patient Instructions (Signed)
   Flexors, Lunge  Maintain pelvic tuck under, lift pubic bone toward navel. Engage posterior hip muscles (firm glute muscles of leg in back position) and shift forward until you feel stretch on front of leg that is down. To increase stretch, maintain balance and ease hips forward. You may use one hand on a chair for balance if needed. Hold for __5__ breaths. Repeat __2-3__ times each leg.  Do _1-2__ times per day.    Pelvic Rotation: Contract / Relax (Supine)  MET to Correct Right Anteriorly Rotated/Left Posteriorly Rotated Innominate   Begin laying on your back with your feet at 90 degrees. Put a dowel/broomstick  through your legs, behind your right knee and in front of your left knee. Stabilize the dowel on ether side with your hands.  Press down with the right leg and up with the left leg. Hold for 5 seconds  then slowly relax. Repeat 5 times.

## 2019-02-17 NOTE — Therapy (Signed)
[place] MAIN [facility] SERVICES [address] [place], [place], [address] Phone: [phone]   Fax:  [phone]  Physical Therapy Treatment  The patient has been informed of current processes in place at Outpatient Rehab to protect patients from Covid-19 exposure including social distancing, schedule modifications, and new cleaning procedures. After discussing their particular risk with a therapist based on the patient's personal risk factors, the patient has decided to proceed with in-person therapy.   Patient Details  Name: Savannah Mills MRN: [id number] Date of Birth: [birth year] No data recorded  Encounter Date: 02/17/2019  PT End of Session - 02/17/19 0940    Visit Number  2    Number of Visits  10    Date for PT Re-Evaluation  04/22/19    Authorization Type  cigna    Authorization Time Period  from 02/11/2019 to 04/22/2019    Authorization - Visit Number  2    Authorization - Number of Visits  10    PT Start Time  0815    PT Stop Time  0915    PT Time Calculation (min)  60 min    Activity Tolerance  Patient tolerated treatment well;No increased pain    Behavior During Therapy  WFL for tasks assessed/performed       Past Medical History:  Diagnosis Date  . Anemia   . Anxiety   . Bilateral breast cysts   . IBS (irritable bowel syndrome)     Past Surgical History:  Procedure Laterality Date  . COLONOSCOPY  6 years  . ingrown toenail excision    . MINOR HEMORRHOIDECTOMY    . UPPER GI ENDOSCOPY  6 years ago  . WISDOM TOOTH EXTRACTION      There were no vitals filed for this visit.      Pelvic Floor Physical Therapy Treatment Note  SCREENING  Changes in medications, allergies, or medical history?: none    SUBJECTIVE  Patient reports: Has been having L elbow pain over the last week, not sure why.  Precautions:  IBS, h/o anxiety but has been coping well with both since being with her husband  Pain  update: Location of pain: LBP (elbow) Current pain: 0/10 (5-6) Max pain: 6/10 (7-8) Least pain: 0/10 (0) Nature of pain:sharp   Patient Goals: Having more control over her bladder, not have pain or pressure with daily activities and be able to have intercourse without pain.    OBJECTIVE  Changes in: Posture/Observations:  R anterior rotation, RLE long in supine and standing pre-treatment  -~90% improved following treatment.  Range of Motion/Flexibilty:  Decreased sacral mobility B with radiating Sx. Into L LE to mid-thigh with pressure at L sacral base (resolved following)  Strength/MMT:  LE MMT:  Pelvic floor:  Abdominal:   Palpation: TTP to R Iliacus and pectineus  Gait Analysis:  INTERVENTIONS THIS SESSION: Manual: Performed TP release to R iliacus and pectineus followed by MET correction x3 for R anterior rotation, superior/posterior rotation to R innominate with pressure at both symphysis and iliac crest, MWM in L side-lying all to improve pelvic alignment and decrease imbalance of musculature for decreased Sx.  Therex: educated on and practiced self MET correction for R anterior rotation with broomstick and hip-flexor stretch in standing for decreased imbalance of musculature and improved pelvic alignment to allow PFM to have optimal length-tension relationship and decreased Sx.  Total time: 60 min.  PT Short Term Goals - 02/11/19 1504      PT SHORT TERM GOAL #1   Title  Patient will demonstrate a coordinated contraction, relaxation, and bulge of the pelvic floor muscles to demonstrate functional recruitment and motion and allow for further strengthening.    Baseline  Pt. history suggests PFM spasms and poor cordination    Time  5    Period  Weeks    Status  New    Target Date  03/18/19      PT SHORT TERM GOAL #2   Title  Patient will demonstrate improved pelvic alignment and balance of musculature  surrounding the pelvis to facilitate decreased PFM spasms and decrease pelvic pain.    Baseline  L up-slip vs. RLE long, spasms surrounding the pelvis.    Time  5    Period  Weeks    Status  New    Target Date  03/18/19      PT SHORT TERM GOAL #3   Title  Patient will demonstrate HEP x1 in the clinic to demonstrate understanding and proper form to allow for further improvement.    Baseline  Pt. lacks knowledge of therapeutic exercises that can decrease symptoms.    Time  5    Period  Weeks    Target Date  03/18/19        PT Long Term Goals - 02/11/19 1550      PT LONG TERM GOAL #1   Title  Patient will report no episodes of SUI/UUI over the course of the prior two weeks to demonstrate improved functional ability.    Baseline  pt. demonstrates SUI with every cough/sneeze etc, and UUI occasionally with leakage if trying to delay> 10 min.    Time  10    Period  Weeks    Status  New    Target Date  04/22/19      PT LONG TERM GOAL #2   Title  Patient will report no pain with intercourse to demonstrate improved functional ability.    Baseline  Hx. of pain greatest with initial penetration before pregnancy that she required lidocaine cream for.    Time  10    Period  Weeks    Status  New    Target Date  04/22/19      PT LONG TERM GOAL #3   Title  Patient will score at or below 58/300 on the PFDI and 41/300 on the PFIQ to demonstrate a clinically meaningful decrease in disability and distress due to pelvic floor dysfunction.    Baseline  PFDI: 103/300, PFIQ:86/300    Time  10    Period  Weeks    Status  New    Target Date  04/22/19      PT LONG TERM GOAL #4   Title  Patient will score less than or equal to 20% on the Female NIH-CPSI to demonstrate a reduction in pain, urinary symptoms, and an improved quality of life.    Baseline  Female NIH-CPSI: 28/43 (65%)    Time  10    Period  Weeks    Status  New    Target Date  04/22/19      PT LONG TERM GOAL #5   Title  Patient  will report having BM's at least every-other day with consistency between [facility] stool scale 3-5 over the prior week to demonstrate decreased constipation.    Baseline  Pt. is having multiple type 1-2 BM's daily  Time  10    Period  Weeks    Status  New    Target Date  04/22/19            Plan - 02/17/19 0940    Clinical Impression Statement  Pt. Responded well to all interventions today, demonstrating improved pelvic alignment, decreased spasm, improved sacral mobility, as well as understanding and correct performance of all education and exercises provided today. They will continue to benefit from skilled physical therapy to work toward remaining goals and maximize function as well as decrease likelihood of symptom increase or recurrence.     PT Next Visit Plan  re-assess pelvic alignment, TP release vs. DN PRN, internal assessment when appropriate, strengthen for imbalances at hips, give scoliosis exercises..    PT Home Exercise Plan  breathing, soda-can and sit-to-stand, MET correction for R anterior, standing hip-flexor stretch, L heel-lift given.    Consulted and Agree with Plan of Care  Patient       Patient will benefit from skilled therapeutic intervention in order to improve the following deficits and impairments:     Visit Diagnosis: Other muscle spasm  Abnormal posture  Sacrococcygeal disorders, not elsewhere classified     Problem List Patient Active Problem List   Diagnosis Date Noted  . Labor and delivery indication for care or intervention 12/14/2018  . Mastalgia 07/06/2016   Willa Rough DPT, ATC Willa Rough 02/17/2019, 9:42 AM  [place] MAIN [facility] SERVICES [address] [place], [place], [address] Phone: [phone]   Fax:  [phone]  Name: Hanley Rispoli MRN: [id number] Date of Birth: [birth year]

## 2019-02-24 ENCOUNTER — Ambulatory Visit: Payer: Managed Care, Other (non HMO)

## 2019-02-24 ENCOUNTER — Other Ambulatory Visit: Payer: Self-pay

## 2019-02-24 DIAGNOSIS — M62838 Other muscle spasm: Secondary | ICD-10-CM

## 2019-02-24 DIAGNOSIS — M533 Sacrococcygeal disorders, not elsewhere classified: Secondary | ICD-10-CM

## 2019-02-24 DIAGNOSIS — R293 Abnormal posture: Secondary | ICD-10-CM

## 2019-02-24 NOTE — Patient Instructions (Signed)
  Hip Abduction: Side Leg Lift - Side-Lying    Lie on side. Draw lower tummy muscle (TA) and pelvic floor in, Lift top leg until you feel strong contraction of muscle on the side of the hip. Keep top leg straight with body, toes pointing forward. Do not let your hip roll back! Do _10__ reps per set, __3_ sets per day   Clam Shells  2 sets of 10 with 1-2 second hold.      Do 10 "towel scrunches" where the toes come under first, then :   Do 2x10 where you keep the toes on the ground and just try to bring your arch up.  [email or web address]=[email or web address]    [email or web address]&refRID=[email or web address]    Arch supports will help align your anke

## 2019-02-24 NOTE — Therapy (Signed)
[place] MAIN [facility] SERVICES [address] [place], [place], [address] Phone: [phone]   Fax:  [phone]  Physical Therapy Treatment  The patient has been informed of current processes in place at Outpatient Rehab to protect patients from Covid-19 exposure including social distancing, schedule modifications, and new cleaning procedures. After discussing their particular risk with a therapist based on the patient's personal risk factors, the patient has decided to proceed with in-person therapy.   Patient Details  Name: Savannah Mills MRN: [id number] Date of Birth: [birth year] No data recorded  Encounter Date: 02/24/2019  PT End of Session - 02/24/19 5929    Visit Number  3    Number of Visits  10    Date for PT Re-Evaluation  04/22/19    Authorization Type  cigna    Authorization Time Period  from 02/11/2019 to 04/22/2019    Authorization - Visit Number  3    Authorization - Number of Visits  10    PT Start Time  0825    PT Stop Time  0927    PT Time Calculation (min)  62 min    Activity Tolerance  Patient tolerated treatment well;No increased pain    Behavior During Therapy  WFL for tasks assessed/performed       Past Medical History:  Diagnosis Date  . Anemia   . Anxiety   . Bilateral breast cysts   . IBS (irritable bowel syndrome)     Past Surgical History:  Procedure Laterality Date  . COLONOSCOPY  6 years  . ingrown toenail excision    . MINOR HEMORRHOIDECTOMY    . UPPER GI ENDOSCOPY  6 years ago  . WISDOM TOOTH EXTRACTION      There were no vitals filed for this visit.    Pelvic Floor Physical Therapy Treatment Note  SCREENING  Changes in medications, allergies, or medical history?: none    SUBJECTIVE  Patient reports: Has not had any SUI and has not had to use the bathroom as often, now ~ 1 time every 2 hours. Pain has been less frequent, increased with walk yesterday. Has not had vaginal pain every  day this week, has still had some pain with bending forward.  Precautions:  IBS, h/o anxiety but has been coping well with both since being with her husband  Pain update: Location of pain: LBP (elbow) Current pain: 0/10 (5-6) Max pain: 6/10 (7-8) Least pain: 0/10 (0) Nature of pain:sharp   Patient Goals: Having more control over her bladder, not have pain or pressure with daily activities and be able to have intercourse without pain.    OBJECTIVE  Changes in: Posture/Observations:  Slight R anterior rotation still noted in supine, less evident in standing   Range of Motion/Flexibilty:  Forward bend ROM improved from being ~ 6 in. From floor pre-session to having fingers nearly flat on the floor following treatment.   Decreased lumbar ROM with pain/Sx. Radiating into lower abdomen and thigh. (improved following treatment.)  Strength/MMT:   Pt. Has difficulty recruiting Glute Med, deep hip ER's pre-session, back and lateral abdominal muscles trying to compensate.   Able to get ~ 75% of ROM with AROM following treatment and demonstrate appropriate performance.  Pt. Reports "cramping sensation" when attempting arch crunches on first attempt due to weakness and poor recruitment, able to perform appropriately without cramping following 1 set of toe curls.   Pelvic floor:  Abdominal:   Palpation: TTP to B lumbar paraspinals,  OI, and Piriformis.  Gait Analysis:  INTERVENTIONS THIS SESSION: Manual: Performed TP release to B lumbar paraspinals, OI, and Piriformis to decrease spasm and pain and allow for improved balance of musculature for improved function and decreased symptoms. Performed grade 3-4 PA mobs to lumbar spine to improve mobility of joint and surrounding connective tissue and decrease pressure on nerve roots for improved conductivity and function of down-stream tissues.    Therex: educated on and practiced 3-way wall stretch To maintain and improve muscle  length and allow for improved balance of musculature for long-term symptom relief. Educated on and practiced hip ABD, ER in side-lying and arch crunches and toe curls in seated to improve BLE alignment in the pelvis and work toward improved gait mechanics to decrease pain and improve function.  Total time: 62 min.                            PT Short Term Goals - 02/11/19 1504      PT SHORT TERM GOAL #1   Title  Patient will demonstrate a coordinated contraction, relaxation, and bulge of the pelvic floor muscles to demonstrate functional recruitment and motion and allow for further strengthening.    Baseline  Pt. history suggests PFM spasms and poor cordination    Time  5    Period  Weeks    Status  New    Target Date  03/18/19      PT SHORT TERM GOAL #2   Title  Patient will demonstrate improved pelvic alignment and balance of musculature surrounding the pelvis to facilitate decreased PFM spasms and decrease pelvic pain.    Baseline  L up-slip vs. RLE long, spasms surrounding the pelvis.    Time  5    Period  Weeks    Status  New    Target Date  03/18/19      PT SHORT TERM GOAL #3   Title  Patient will demonstrate HEP x1 in the clinic to demonstrate understanding and proper form to allow for further improvement.    Baseline  Pt. lacks knowledge of therapeutic exercises that can decrease symptoms.    Time  5    Period  Weeks    Target Date  03/18/19        PT Long Term Goals - 02/11/19 1550      PT LONG TERM GOAL #1   Title  Patient will report no episodes of SUI/UUI over the course of the prior two weeks to demonstrate improved functional ability.    Baseline  pt. demonstrates SUI with every cough/sneeze etc, and UUI occasionally with leakage if trying to delay> 10 min.    Time  10    Period  Weeks    Status  New    Target Date  04/22/19      PT LONG TERM GOAL #2   Title  Patient will report no pain with intercourse to demonstrate improved  functional ability.    Baseline  Hx. of pain greatest with initial penetration before pregnancy that she required lidocaine cream for.    Time  10    Period  Weeks    Status  New    Target Date  04/22/19      PT LONG TERM GOAL #3   Title  Patient will score at or below 58/300 on the PFDI and 41/300 on the PFIQ to demonstrate a clinically meaningful decrease in disability and distress  due to pelvic floor dysfunction.    Baseline  PFDI: 103/300, PFIQ:86/300    Time  10    Period  Weeks    Status  New    Target Date  04/22/19      PT LONG TERM GOAL #4   Title  Patient will score less than or equal to 20% on the Female NIH-CPSI to demonstrate a reduction in pain, urinary symptoms, and an improved quality of life.    Baseline  Female NIH-CPSI: 28/43 (65%)    Time  10    Period  Weeks    Status  New    Target Date  04/22/19      PT LONG TERM GOAL #5   Title  Patient will report having BM's at least every-other day with consistency between [facility] stool scale 3-5 over the prior week to demonstrate decreased constipation.    Baseline  Pt. is having multiple type 1-2 BM's daily    Time  10    Period  Weeks    Status  New    Target Date  04/22/19            Plan - 02/24/19 3735    Clinical Impression Statement  Pt. Responded well to all interventions today, demonstrating decreased spasms, improved ROM, and improved muscle recruitment and timing of deep-core activation with LE exercise  as well as understanding and correct performance of all education and exercises provided today. They will continue to benefit from skilled physical therapy to work toward remaining goals and maximize function as well as decrease likelihood of symptom increase or recurrence.    PT Next Visit Plan  assess gait and give seated, standing, and sittingposture education. D/C  MET correction? TP release vs. DN PRN, internal assessment when appropriate, strengthen for imbalances at hips, give scoliosis  exercises..    PT Home Exercise Plan  breathing, soda-can and sit-to-stand, MET correction for R anterior, standing hip-flexor stretch, L heel-lift.    Consulted and Agree with Plan of Care  Patient       Patient will benefit from skilled therapeutic intervention in order to improve the following deficits and impairments:     Visit Diagnosis: Other muscle spasm  Abnormal posture  Sacrococcygeal disorders, not elsewhere classified     Problem List Patient Active Problem List   Diagnosis Date Noted  . Labor and delivery indication for care or intervention 12/14/2018  . Mastalgia 07/06/2016   Willa Rough DPT, ATC Willa Rough 02/24/2019, 9:51 AM  [place] MAIN [facility] SERVICES [address] [place], [place], [address] Phone: [phone]   Fax:  [phone]  Name: Tanee Henery MRN: [id number] Date of Birth: 1996/03/08

## 2019-03-03 ENCOUNTER — Other Ambulatory Visit: Payer: Self-pay

## 2019-03-03 ENCOUNTER — Ambulatory Visit: Payer: Managed Care, Other (non HMO) | Attending: Obstetrics and Gynecology

## 2019-03-03 DIAGNOSIS — M62838 Other muscle spasm: Secondary | ICD-10-CM | POA: Insufficient documentation

## 2019-03-03 DIAGNOSIS — R293 Abnormal posture: Secondary | ICD-10-CM | POA: Insufficient documentation

## 2019-03-03 DIAGNOSIS — M533 Sacrococcygeal disorders, not elsewhere classified: Secondary | ICD-10-CM | POA: Diagnosis present

## 2019-03-03 NOTE — Patient Instructions (Addendum)
3-Way Wall Stretches for Pelvic Floor Lengthening   Bring bottom close to the wall and gently press straighten knees to feel a stretch down the back of you thighs. Hold while taking 5 deep belly breaths and feeling the pelvic floor relax and lower on each inhale.    Let your legs fall to the side to feel a stretch on the inside of your thighs. If the stretch is too intense you can use a pillow to take some of the weight off by wedging it on the outside of your hips. Hold while taking 5 deep belly breaths and feeling the pelvic floor relax and lower on each inhale.    Slide feet down the wall and move hips slightly away from the wall and then let your knees fall to the sides so you feel a stretch on the inside of the thighs near your groin. Hold while taking 5 deep belly breaths and feeling the pelvic floor relax and lower on each inhale.     *Perform each stretch in sequence 3 times, once a day      This is The QL muscle  To perform release on this muscle, start by getting into this position by bridging the hips up and then slowly lowering your back, then your butt down to lengthen the low back then put the ball under you where you feel the tender spot and roll to the same side slightly to add pressure as needed. Hold still and take deep breaths until the pain is at least 50% less or, ideally, just pressure.   This is your piriformis    To release this muscle start in this position with your ankle crossed over the opposite knee. Place the tennis ball under your buttock where the tender spot is and then slightly roll your weight to the same side to put just enough pressure that it is uncomfortable. Hold and take deep breaths until the pain is at least 50% less or, ideally ,just pressure.   This is your Gluteus Medius and Minimus  To perform release on this muscle, start by getting into this position by bridging the hips up and then slowly lowering your back, then your butt down to lengthen  the low back then put the ball under you where you feel the tender spot and roll to the same side slightly to add pressure as needed. Hold still and take deep breaths until the pain is at least 50% less or, ideally, just pressure.   These are your deep hip-flexor muscles. They are easiest to reach where they come together at the hip.   To perform release on this muscle, start by getting into this position by laying on your stomach then put the ball under you where you feel the tender spot and bring the opposite knee up/out to the side to add pressure as needed. Hold still and take deep breaths until the pain is at least 50% less or, ideally ,just pressure.     Hold for 30 seconds (5 deep breaths) and repeat 2-3 times on each side once a day  Bridges    Exhale and pull the low back into the table before using the glutes to bridge your hips up off of the mat, just to the point that you can continue to keep the pelvis neutral. Inhale slightly at the top and then exhale to roll back down one spinal segment at a time, bring the upper then middle, then low back before finally letting  the bottom relax down.  Do 2x15, 1 time per day.   Exhale and tuck the pelvis under (flatten the low back) ,then bring the knees away from the mid-line and slowly return to the middle, focusing on control. Do 2x15, 1 time per day.

## 2019-03-03 NOTE — Therapy (Signed)
[place] [facility] MAIN [facility] SERVICES [address] [place], [place], [address] Phone: [phone]   Fax:  [phone]  Physical Therapy Treatment  The patient has been informed of current processes in place at Outpatient Rehab to protect patients from Covid-19 exposure including social distancing, schedule modifications, and new cleaning procedures. After discussing their particular risk with a therapist based on the patient's personal risk factors, the patient has decided to proceed with in-person therapy.  Patient Details  Name: Savannah Mills MRN: [id number] Date of Birth: [birth year] No data recorded  Encounter Date: 03/03/2019  PT End of Session - 03/03/19 0941    Visit Number  4    Number of Visits  10    Date for PT Re-Evaluation  04/22/19    Authorization Type  cigna    Authorization Time Period  from 02/11/2019 to 04/22/2019    Authorization - Visit Number  4    Authorization - Number of Visits  10    PT Start Time  0830    PT Stop Time  0930    PT Time Calculation (min)  60 min    Activity Tolerance  Patient tolerated treatment well;No increased pain    Behavior During Therapy  WFL for tasks assessed/performed       Past Medical History:  Diagnosis Date  . Anemia   . Anxiety   . Bilateral breast cysts   . IBS (irritable bowel syndrome)     Past Surgical History:  Procedure Laterality Date  . COLONOSCOPY  6 years  . ingrown toenail excision    . MINOR HEMORRHOIDECTOMY    . UPPER GI ENDOSCOPY  6 years ago  . WISDOM TOOTH EXTRACTION      There were no vitals filed for this visit.      Pelvic Floor Physical Therapy Treatment Note  SCREENING  Changes in medications, allergies, or medical history?: none    SUBJECTIVE  Patient reports: Can go ~ 4 hours between urinating now, not having leakage with sneezing, only slight pain in the low back when bending forward for pick up the baby. Doing well with the exercises.  Still feels weak in L hip.   Precautions:  IBS, h/o anxiety but has been coping well with both since being with her husband  Pain update: Location of pain: LBP Current pain: 0/10 Max pain: 3/10  Least pain: 0/10  Nature of pain:sharp   Patient Goals: Having more control over her bladder, not have pain or pressure with daily activities and be able to have intercourse without pain.    OBJECTIVE  Changes in: Posture/Observations:  R PSIS and ASIS high in standing (without heel-lift) L ASIS high in supine   Range of Motion/Flexibilty:  Forward bend ROM improved from being ~ 6 in. From floor pre-session to having fingers nearly flat on the floor following treatment.   Decreased lumbar ROM with pain/Sx. Radiating into lower abdomen and thigh. (improved following treatment.)  Strength/MMT:   Pt. Demonstrates decreased L>R glute recruitment with bridge but able to demonstrate improvement with band around knees and focus on slow eccentric control with VC.   Pelvic floor:  Abdominal:  Able to recruit deep-core in hook-lying with cue for "flatten your back"   Palpation: TTP to L QL, Psoas, Iliacus, and B Glute med.   Gait Analysis:  INTERVENTIONS THIS SESSION: Manual: Performed TP release to L QL, Psoas, and Iliacus and educated Pt. On how to perform self TP release with  a tennis ball to these and other posterior hip musculature to decrease spasm and pain and allow for improved balance of musculature for improved function and decreased symptoms.   Therex: educated on and practiced seated side stretch To maintain and improve muscle length and allow for improved balance of musculature for long-term symptom relief. Educated on and practiced supine clam-shells with a green band and posterior pelvic tilt as well as pilates-style bridges to improve strength of muscles opposing tight musculature to allow reciprocal inhibition to improve balance of musculature surrounding the  pelvis and improve overall posture for optimal musculature length-tension relationship and function.   Total time: 60 min.                             PT Short Term Goals - 02/11/19 1504      PT SHORT TERM GOAL #1   Title  Patient will demonstrate a coordinated contraction, relaxation, and bulge of the pelvic floor muscles to demonstrate functional recruitment and motion and allow for further strengthening.    Baseline  Pt. history suggests PFM spasms and poor cordination    Time  5    Period  Weeks    Status  New    Target Date  03/18/19      PT SHORT TERM GOAL #2   Title  Patient will demonstrate improved pelvic alignment and balance of musculature surrounding the pelvis to facilitate decreased PFM spasms and decrease pelvic pain.    Baseline  L up-slip vs. RLE long, spasms surrounding the pelvis.    Time  5    Period  Weeks    Status  New    Target Date  03/18/19      PT SHORT TERM GOAL #3   Title  Patient will demonstrate HEP x1 in the clinic to demonstrate understanding and proper form to allow for further improvement.    Baseline  Pt. lacks knowledge of therapeutic exercises that can decrease symptoms.    Time  5    Period  Weeks    Target Date  03/18/19        PT Long Term Goals - 02/11/19 1550      PT LONG TERM GOAL #1   Title  Patient will report no episodes of SUI/UUI over the course of the prior two weeks to demonstrate improved functional ability.    Baseline  pt. demonstrates SUI with every cough/sneeze etc, and UUI occasionally with leakage if trying to delay> 10 min.    Time  10    Period  Weeks    Status  New    Target Date  04/22/19      PT LONG TERM GOAL #2   Title  Patient will report no pain with intercourse to demonstrate improved functional ability.    Baseline  Hx. of pain greatest with initial penetration before pregnancy that she required lidocaine cream for.    Time  10    Period  Weeks    Status  New    Target Date   04/22/19      PT LONG TERM GOAL #3   Title  Patient will score at or below 58/300 on the PFDI and 41/300 on the PFIQ to demonstrate a clinically meaningful decrease in disability and distress due to pelvic floor dysfunction.    Baseline  PFDI: 103/300, PFIQ:86/300    Time  10    Period  Weeks  Status  New    Target Date  04/22/19      PT LONG TERM GOAL #4   Title  Patient will score less than or equal to 20% on the Female NIH-CPSI to demonstrate a reduction in pain, urinary symptoms, and an improved quality of life.    Baseline  Female NIH-CPSI: 28/43 (65%)    Time  10    Period  Weeks    Status  New    Target Date  04/22/19      PT LONG TERM GOAL #5   Title  Patient will report having BM's at least every-other day with consistency between [facility] stool scale 3-5 over the prior week to demonstrate decreased constipation.    Baseline  Pt. is having multiple type 1-2 BM's daily    Time  10    Period  Weeks    Status  New    Target Date  04/22/19            Plan - 03/03/19 0918    Clinical Impression Statement  Pt. Responded well to all interventions today, demonstrating improved pelvic alignment and decreased spasms surrounding L hip as well as understanding and correct performance of all education and exercises provided today. They will continue to benefit from skilled physical therapy to work toward remaining goals and maximize function as well as decrease likelihood of symptom increase or recurrence.     PT Next Visit Plan  assess gait and give seated, standing, and sittingposture education. TP release vs. DN PRN, internal assessment when appropriate, strengthen for imbalances at hips, give scoliosis exercises..    PT Home Exercise Plan  breathing, soda-can and sit-to-stand, standing hip-flexor stretch, L heel-lift, tennis ball release, seated pelvic tilts, bridges, supine clam shells, side-stretch.Marland Kitchen    Consulted and Agree with Plan of Care  Patient       Patient will  benefit from skilled therapeutic intervention in order to improve the following deficits and impairments:     Visit Diagnosis: Other muscle spasm  Abnormal posture  Sacrococcygeal disorders, not elsewhere classified     Problem List Patient Active Problem List   Diagnosis Date Noted  . Labor and delivery indication for care or intervention 12/14/2018  . Mastalgia 07/06/2016   Willa Rough DPT, ATC Willa Rough 03/03/2019, 9:52 AM  [place] MAIN [facility] SERVICES [address] [place], [place], [address] Phone: [phone]   Fax:  [phone]  Name: Savannah Mills MRN: [id number] Date of Birth: 17-Aug-1995

## 2019-03-10 ENCOUNTER — Other Ambulatory Visit: Payer: Self-pay

## 2019-03-10 ENCOUNTER — Ambulatory Visit: Payer: Managed Care, Other (non HMO)

## 2019-03-10 DIAGNOSIS — M62838 Other muscle spasm: Secondary | ICD-10-CM

## 2019-03-10 DIAGNOSIS — M533 Sacrococcygeal disorders, not elsewhere classified: Secondary | ICD-10-CM

## 2019-03-10 DIAGNOSIS — R293 Abnormal posture: Secondary | ICD-10-CM

## 2019-03-10 NOTE — Patient Instructions (Signed)
  When seated, you want to maintain pelvic neutral with the shoulders gently down and back and ears in line with your shoulders. A lumbar roll such as the one below or a home-made towel-roll can be used for this purpose. Even Olympic athletes can only maintain proper seated posture for about 10 minutes without support!    Pictured: The Original [employer] Lumbar Roll   Shoulder Retraction and Downward Rotation   Rotate the shoulder blades back and down as if you had to hold a pencil between them, holding for 1 full second each time. Repeat this _3x10_ times _1-3_ times per day.      Start with Shoulder Retraction and Downward Rotation pictures above, holding the position while pulling the chin straight back as if trying to make a "double chin".  Breathe in forward and breathe out as you pull back, repeating this _3x10__ times __1-3__ times per day.

## 2019-03-10 NOTE — Therapy (Signed)
[place] MAIN [facility] SERVICES [address] [place], [place], [address] Phone: [phone]   Fax:  [phone]  Physical Therapy Treatment  The patient has been informed of current processes in place at Outpatient Rehab to protect patients from Covid-19 exposure including social distancing, schedule modifications, and new cleaning procedures. After discussing their particular risk with a therapist based on the patient's personal risk factors, the patient has decided to proceed with in-person therapy.   Patient Details  Name: Savannah Mills MRN: [id number] Date of Birth: Mar 04, 1996 No data recorded  Encounter Date: 03/10/2019  PT End of Session - 03/10/19 0953    Visit Number  5    Number of Visits  10    Date for PT Re-Evaluation  04/22/19    Authorization Type  cigna    Authorization Time Period  from 02/11/2019 to 04/22/2019    Authorization - Visit Number  5    Authorization - Number of Visits  10    PT Start Time  0830    PT Stop Time  0930    PT Time Calculation (min)  60 min    Activity Tolerance  Patient tolerated treatment well;No increased pain    Behavior During Therapy  WFL for tasks assessed/performed       Past Medical History:  Diagnosis Date  . Anemia   . Anxiety   . Bilateral breast cysts   . IBS (irritable bowel syndrome)     Past Surgical History:  Procedure Laterality Date  . COLONOSCOPY  6 years  . ingrown toenail excision    . MINOR HEMORRHOIDECTOMY    . UPPER GI ENDOSCOPY  6 years ago  . WISDOM TOOTH EXTRACTION      There were no vitals filed for this visit.    Pelvic Floor Physical Therapy Treatment Note  SCREENING  Changes in medications, allergies, or medical history?: none    SUBJECTIVE  Patient reports:  Tuesday she had a spasm in the low back when standing up, was able to relieve it with tennis ball. Was restless last night and had some aching pain int the mid/upper back and knees  (thinks it could be arthritis because it was going to rain today). Has not had any leakage. Had "soreness quicker" than before she had the baby, felt deeper and lasted a few min. After intercourse.  Precautions:  IBS, h/o anxiety but has been coping well with both since being with her husband  Pain update: Location of pain: LBP Current pain: 0/10 Max pain: 8/10 (during spasm) (5/10 last night/restless)  Least pain: 0/10  Nature of pain:sharp   Patient Goals: Having more control over her bladder, not have pain or pressure with daily activities and be able to have intercourse without pain.    OBJECTIVE  Changes in: Posture/Observations:  ASIS and PSIS even in standing, R anterior rotation noticeable in lying. "tightness" in R lower spine. FHP and forward shoulders  Range of Motion/Flexibilty:  Decreased L rotation>R. Decreased ~ 10th rib excursion on R. Decreased spinal mobility through mid-thoracic and all sacral borders. Decreased C2 A/P mobility, spinous process deviated R.  Strength/MMT:    Pelvic floor:  Abdominal:    Palpation: TTP to R QL, Paraspinals and multifidus at ~ T6 and ~C2  Gait Analysis:  INTERVENTIONS THIS SESSION: Manual: Performed TP release to  R QL, Paraspinals and multifidus at ~ T6 and ~C2 to decrease spasm and pain and allow for improved balance of musculature  for improved function and decreased symptoms. Performed grade 3-4 PA mobs to mid-thoracic and all sacral borders, C2 and 10th rib as well as MWM into L rotation and with breathing at 10th rib to improve mobility of joint and surrounding connective tissue and decrease pressure on nerve roots for improved conductivity and function of down-stream tissues.    Therex: educated on and practiced seated chin-tucks and scapular retractions to improve strength of muscles opposing tight musculature to allow reciprocal inhibition to improve balance of musculature surrounding the shoulder complex and  secondarily allowing for pelvic alignment and improve overall posture for optimal musculature length-tension relationship and function.  Self-care: Educated on and practiced sitting posture and how to use supports to maintain improved posture with return to work.    Total time: 60 min.                              PT Short Term Goals - 02/11/19 1504      PT SHORT TERM GOAL #1   Title  Patient will demonstrate a coordinated contraction, relaxation, and bulge of the pelvic floor muscles to demonstrate functional recruitment and motion and allow for further strengthening.    Baseline  Pt. history suggests PFM spasms and poor cordination    Time  5    Period  Weeks    Status  New    Target Date  03/18/19      PT SHORT TERM GOAL #2   Title  Patient will demonstrate improved pelvic alignment and balance of musculature surrounding the pelvis to facilitate decreased PFM spasms and decrease pelvic pain.    Baseline  L up-slip vs. RLE long, spasms surrounding the pelvis.    Time  5    Period  Weeks    Status  New    Target Date  03/18/19      PT SHORT TERM GOAL #3   Title  Patient will demonstrate HEP x1 in the clinic to demonstrate understanding and proper form to allow for further improvement.    Baseline  Pt. lacks knowledge of therapeutic exercises that can decrease symptoms.    Time  5    Period  Weeks    Target Date  03/18/19        PT Long Term Goals - 02/11/19 1550      PT LONG TERM GOAL #1   Title  Patient will report no episodes of SUI/UUI over the course of the prior two weeks to demonstrate improved functional ability.    Baseline  pt. demonstrates SUI with every cough/sneeze etc, and UUI occasionally with leakage if trying to delay> 10 min.    Time  10    Period  Weeks    Status  New    Target Date  04/22/19      PT LONG TERM GOAL #2   Title  Patient will report no pain with intercourse to demonstrate improved functional ability.     Baseline  Hx. of pain greatest with initial penetration before pregnancy that she required lidocaine cream for.    Time  10    Period  Weeks    Status  New    Target Date  04/22/19      PT LONG TERM GOAL #3   Title  Patient will score at or below 58/300 on the PFDI and 41/300 on the PFIQ to demonstrate a clinically meaningful decrease in disability and distress due  to pelvic floor dysfunction.    Baseline  PFDI: 103/300, PFIQ:86/300    Time  10    Period  Weeks    Status  New    Target Date  04/22/19      PT LONG TERM GOAL #4   Title  Patient will score less than or equal to 20% on the Female NIH-CPSI to demonstrate a reduction in pain, urinary symptoms, and an improved quality of life.    Baseline  Female NIH-CPSI: 28/43 (65%)    Time  10    Period  Weeks    Status  New    Target Date  04/22/19      PT LONG TERM GOAL #5   Title  Patient will report having BM's at least every-other day with consistency between [facility] stool scale 3-5 over the prior week to demonstrate decreased constipation.    Baseline  Pt. is having multiple type 1-2 BM's daily    Time  10    Period  Weeks    Status  New    Target Date  04/22/19            Plan - 03/10/19 0953    Clinical Impression Statement  Pt. Responded well to all interventions today, demonstrating improved spinal and rib mobility and decreased spasms as well as understanding and correct performance of all education and exercises provided today. They will continue to benefit from skilled physical therapy to work toward remaining goals and maximize function as well as decrease likelihood of symptom increase or recurrence.     PT Next Visit Plan  assess gait and give standing, and sittingposture education. TP release vs. DN PRN, internal assessment when appropriate, strengthen for imbalances at hips, give scoliosis exercises..    PT Home Exercise Plan  breathing, soda-can and sit-to-stand, standing hip-flexor stretch, L heel-lift, tennis  ball release, seated pelvic tilts, bridges, supine clam shells, side-stretch, MET correction for R anterior rotation, seated posture, chin-tucks and scapular retractions    Consulted and Agree with Plan of Care  Patient       Patient will benefit from skilled therapeutic intervention in order to improve the following deficits and impairments:     Visit Diagnosis: Other muscle spasm  Abnormal posture  Sacrococcygeal disorders, not elsewhere classified     Problem List Patient Active Problem List   Diagnosis Date Noted  . Labor and delivery indication for care or intervention 12/14/2018  . Mastalgia 07/06/2016   Willa Rough DPT, ATC Willa Rough 03/10/2019, 10:04 AM  [place] MAIN [facility] SERVICES [address] [place], [place], [address] Phone: [phone]   Fax:  [phone]  Name: Savannah Mills MRN: [id number] Date of Birth: 1995/11/20

## 2019-03-18 ENCOUNTER — Ambulatory Visit: Payer: Managed Care, Other (non HMO)

## 2019-03-18 ENCOUNTER — Other Ambulatory Visit: Payer: Self-pay

## 2019-03-18 DIAGNOSIS — M62838 Other muscle spasm: Secondary | ICD-10-CM | POA: Diagnosis not present

## 2019-03-18 DIAGNOSIS — M533 Sacrococcygeal disorders, not elsewhere classified: Secondary | ICD-10-CM

## 2019-03-18 DIAGNOSIS — R293 Abnormal posture: Secondary | ICD-10-CM

## 2019-03-18 NOTE — Therapy (Signed)
[place] MAIN [facility] SERVICES [address] [place], [place], [address] Phone: [phone]   Fax:  [phone]  Physical Therapy Treatment  The patient has been informed of current processes in place at Outpatient Rehab to protect patients from Covid-19 exposure including social distancing, schedule modifications, and new cleaning procedures. After discussing their particular risk with a therapist based on the patient's personal risk factors, the patient has decided to proceed with in-person therapy.   Patient Details  Name: Savannah Mills MRN: [id number] Date of Birth: May 23, 1995 No data recorded  Encounter Date: 03/18/2019  PT End of Session - 03/18/19 0754    Visit Number  6    Number of Visits  10    Date for PT Re-Evaluation  04/22/19    Authorization Type  cigna    Authorization Time Period  from 02/11/2019 to 04/22/2019    Authorization - Visit Number  6    Authorization - Number of Visits  10    PT Start Time  [phone]    PT Stop Time  0830    PT Time Calculation (min)  48 min    Activity Tolerance  Patient tolerated treatment well;No increased pain    Behavior During Therapy  WFL for tasks assessed/performed       Past Medical History:  Diagnosis Date  . Anemia   . Anxiety   . Bilateral breast cysts   . IBS (irritable bowel syndrome)     Past Surgical History:  Procedure Laterality Date  . COLONOSCOPY  6 years  . ingrown toenail excision    . MINOR HEMORRHOIDECTOMY    . UPPER GI ENDOSCOPY  6 years ago  . WISDOM TOOTH EXTRACTION      There were no vitals filed for this visit.    Pelvic Floor Physical Therapy Treatment Note  SCREENING  Changes in medications, allergies, or medical history?: none    SUBJECTIVE  Patient reports:  Has been having more pelvic pain since she has been on her period for 6 days. Is having pain following BM's that lasts for 1-2 days whether straining or not. Still not having pain  when holding her son or leakage.  Precautions:  IBS, h/o anxiety but has been coping well with both since being with her husband  Pain update: Location of pain: LBP Current pain: 0/10 Max pain: 8/10 (during spasm) (5/10 last night/restless)  Least pain: 0/10  Nature of pain:sharp   Patient Goals: Having more control over her bladder, not have pain or pressure with daily activities and be able to have intercourse without pain.    OBJECTIVE  Changes in: Posture/Observations:  Slight left lumbar curve, less notable than at initial assessment.   Range of Motion/Flexibilty:  Decreased L side-bend with over head reach due to QL restriction on R.  Strength/MMT:    Pelvic floor:  Abdominal:    Palpation: TTP to R QL   Gait Analysis:  INTERVENTIONS THIS SESSION: Manual: Performed TP release to  R QL to decrease spasm and pain and allow for improved balance of musculature for improved function and decreased symptoms.  Therex: educated on and practiced side-planks on the knees and standing with hand on a chair to allow for improved recruitment and stability of deep-core stabilizers in appropriate alignment to decrease scoliotic curve and the risk for return of spasms.    Theract: Educated on and practiced standing posture and applied kinesio tape in a cross pattern to improve upper spinal  alignment and help improve proprioceptive input to also improve lower spinal alignment to decrease and prevent return of spasms and pain as well as to take pressure off of lumbar nerves to help decrease PFM spasm.  Total time: 60 min.                                PT Short Term Goals - 02/11/19 1504      PT SHORT TERM GOAL #1   Title  Patient will demonstrate a coordinated contraction, relaxation, and bulge of the pelvic floor muscles to demonstrate functional recruitment and motion and allow for further strengthening.    Baseline  Pt. history suggests  PFM spasms and poor cordination    Time  5    Period  Weeks    Status  New    Target Date  03/18/19      PT SHORT TERM GOAL #2   Title  Patient will demonstrate improved pelvic alignment and balance of musculature surrounding the pelvis to facilitate decreased PFM spasms and decrease pelvic pain.    Baseline  L up-slip vs. RLE long, spasms surrounding the pelvis.    Time  5    Period  Weeks    Status  New    Target Date  03/18/19      PT SHORT TERM GOAL #3   Title  Patient will demonstrate HEP x1 in the clinic to demonstrate understanding and proper form to allow for further improvement.    Baseline  Pt. lacks knowledge of therapeutic exercises that can decrease symptoms.    Time  5    Period  Weeks    Target Date  03/18/19        PT Long Term Goals - 02/11/19 1550      PT LONG TERM GOAL #1   Title  Patient will report no episodes of SUI/UUI over the course of the prior two weeks to demonstrate improved functional ability.    Baseline  pt. demonstrates SUI with every cough/sneeze etc, and UUI occasionally with leakage if trying to delay> 10 min.    Time  10    Period  Weeks    Status  New    Target Date  04/22/19      PT LONG TERM GOAL #2   Title  Patient will report no pain with intercourse to demonstrate improved functional ability.    Baseline  Hx. of pain greatest with initial penetration before pregnancy that she required lidocaine cream for.    Time  10    Period  Weeks    Status  New    Target Date  04/22/19      PT LONG TERM GOAL #3   Title  Patient will score at or below 58/300 on the PFDI and 41/300 on the PFIQ to demonstrate a clinically meaningful decrease in disability and distress due to pelvic floor dysfunction.    Baseline  PFDI: 103/300, PFIQ:86/300    Time  10    Period  Weeks    Status  New    Target Date  04/22/19      PT LONG TERM GOAL #4   Title  Patient will score less than or equal to 20% on the Female NIH-CPSI to demonstrate a reduction in  pain, urinary symptoms, and an improved quality of life.    Baseline  Female NIH-CPSI: 28/43 (65%)    Time  10  Period  Weeks    Status  New    Target Date  04/22/19      PT LONG TERM GOAL #5   Title  Patient will report having BM's at least every-other day with consistency between [facility] stool scale 3-5 over the prior week to demonstrate decreased constipation.    Baseline  Pt. is having multiple type 1-2 BM's daily    Time  10    Period  Weeks    Status  New    Target Date  04/22/19            Plan - 03/18/19 1020    Clinical Impression Statement  Pt. Responded well to all interventions today, demonstrating decreased spasms and improved posture/awareness and understanding of how to correct,  as well as understanding and correct performance of all education and exercises provided today. They will continue to benefit from skilled physical therapy to work toward remaining goals and maximize function as well as decrease likelihood of symptom increase or recurrence.     PT Next Visit Plan  assess gait and give standing, and sittingposture education. TP release vs. DN PRN, internal assessment when appropriate, strengthen for imbalances at hips, give scoliosis exercises..    PT Home Exercise Plan  breathing, soda-can and sit-to-stand, standing hip-flexor stretch, L heel-lift, tennis ball release, seated pelvic tilts, bridges, supine clam shells, side-stretch, MET correction for R anterior rotation, seated posture, chin-tucks and scapular retractions, R side-down counter-top plank, posture taping    Consulted and Agree with Plan of Care  Patient       Patient will benefit from skilled therapeutic intervention in order to improve the following deficits and impairments:     Visit Diagnosis: No diagnosis found.     Problem List Patient Active Problem List   Diagnosis Date Noted  . Labor and delivery indication for care or intervention 12/14/2018  . Mastalgia 07/06/2016   Willa Rough DPT, ATC Willa Rough 03/18/2019, 10:34 AM  [place] MAIN [facility] SERVICES [address] [place], [place], [address] Phone: [phone]   Fax:  [phone]  Name: Savannah Mills MRN: [id number] Date of Birth: 07-09-1995

## 2019-03-25 ENCOUNTER — Other Ambulatory Visit: Payer: Self-pay

## 2019-03-25 ENCOUNTER — Ambulatory Visit: Payer: Managed Care, Other (non HMO)

## 2019-03-25 DIAGNOSIS — R293 Abnormal posture: Secondary | ICD-10-CM

## 2019-03-25 DIAGNOSIS — M533 Sacrococcygeal disorders, not elsewhere classified: Secondary | ICD-10-CM

## 2019-03-25 DIAGNOSIS — M62838 Other muscle spasm: Secondary | ICD-10-CM | POA: Diagnosis not present

## 2019-03-25 NOTE — Patient Instructions (Signed)
Kegel exercises:    With neutral spine, tighten pelvic floor by imagining you are stopping the flow of urine, squeezing only around the vagina and anus.  Quick-Flicks: Pull up and in quickly and then relax allowing just enough time for the muscles to full lengthen before the next contraction. Do _10__ repetitions in a row, stopping if the pelvic floor muscle gets tired and other muscles try to take over.  Long-Holds: Hold for _6-10__ seconds and then fully release, repeat __5_ times. (only hold until you can feel the muscles "give up")  Repeat both of these exercises _3-4__ times throughout the day     If this is difficult, start by performing while lying down as shown. As you get stronger, you can try in sitting, standing, etc. Make sure that you are NOT contracting your buttocks or inner thigh muscles!  Tip: If you are having a hard time feeling the muscles, try using a hand-held mirror to observe the muscles lift and draw in or feel with your hand to make sure you are squeezing when you think you are squeezing, not bearing down. Some people find it helpful to pretend you are "picking blueberries" with the vagina to feel the drawing in or try to make the clitoris "nod" and pull your sitz bones in toward the middle.   Pelvic Tilt With Pelvic Floor (Hook-Lying)        Lie with hips and knees bent. Squeeze pelvic floor and flatten low back while breathing out so that pelvis tilts. Repeat _10x2__ times. Do _1-2__ times a day.   "quick fix" Put a pillow under your hips in this position when doing this exercise to help decrease pressure in the pelvis when it feels "heavy".  Self External Trigger Point Relief    1) Wash your hands and prop yourself up in a way where you can easily reach the vagina. You may wish to have a small hand-held mirror near by.  2) Use the 2 middle fingers to put gentle pressure on the three external pelvic floor muscles and hold pressure and take deep breaths as you  allow the tension to release and discomfort to dissipate   3) Repeat the process for any trigger points you find spending between 3-10 minutes on this every 1-2 days until you do not find any more trigger points or you are told otherwise by your therapist.

## 2019-03-25 NOTE — Therapy (Signed)
[place] MAIN [facility] SERVICES [address] [place], [place], [address] Phone: [phone]   Fax:  [phone]  Physical Therapy Treatment  The patient has been informed of current processes in place at Outpatient Rehab to protect patients from Covid-19 exposure including social distancing, schedule modifications, and new cleaning procedures. After discussing their particular risk with a therapist based on the patient's personal risk factors, the patient has decided to proceed with in-person therapy.   Patient Details  Name: Savannah Mills MRN: [id number] Date of Birth: [birth year] No data recorded  Encounter Date: 03/25/2019  PT End of Session - 03/25/19 0902    Visit Number  7    Number of Visits  10    Date for PT Re-Evaluation  04/22/19    Authorization Type  cigna    Authorization Time Period  from 02/11/2019 to 04/22/2019    Authorization - Visit Number  7    Authorization - Number of Visits  10    PT Start Time  0739    PT Stop Time  0832    PT Time Calculation (min)  53 min    Activity Tolerance  Patient tolerated treatment well;No increased pain    Behavior During Therapy  WFL for tasks assessed/performed       Past Medical History:  Diagnosis Date  . Anemia   . Anxiety   . Bilateral breast cysts   . IBS (irritable bowel syndrome)     Past Surgical History:  Procedure Laterality Date  . COLONOSCOPY  6 years  . ingrown toenail excision    . MINOR HEMORRHOIDECTOMY    . UPPER GI ENDOSCOPY  6 years ago  . WISDOM TOOTH EXTRACTION      There were no vitals filed for this visit.     Pelvic Floor Physical Therapy Treatment Note  SCREENING  Changes in medications, allergies, or medical history?: none    SUBJECTIVE  Patient reports: Does have some prolapse that she has felt all week. No increased pain but feeling of bulge and visible bulge. Doing ok with bladder control, no leakage. No pain with  intercourse.  Precautions:  IBS, h/o anxiety but has been coping well with both since being with her husband  Pain update: Location of pain: LBP Current pain: 0/10 Max pain: 3/10  Least pain: 0/10  Nature of pain:sharp   Patient Goals: Having more control over her bladder, not have pain or pressure with daily activities and be able to have intercourse without pain.    OBJECTIVE  Changes in: Posture/Observations:  Very slight L ilium high, L pubic ramus higher than L.  Range of Motion/Flexibilty:    Strength/MMT:    Pelvic Floor External Exam: Introitus Appears: gaping Skin integrity: scar tissue visible on L>R Palpation: TTP to L>R IC and STP Cough: not assessed Prolapse visible?: yes, both anterior and posterior walls at ~ 1/2 cm above introitus with bearing down. Scar mobility: decreased mobility on L>R  Internal Vaginal Exam: Strength (PERF): ~2+/5 pre-treatment, ~ 4/5 post-treatment Symmetry: greater spasms on L>R Palpation: TTP to coccygeus and anterior PR as well as PR at introitus on R, all areas on L Prolapse: yes, both anterior and posterior walls at ~ 1/2 cm above introitus with bearing down   Abdominal:  Pt. Requires min. Cueing to use belly breathing, has some restriction in ability to expand ribs.  Palpation: TTP to B adductors  Gait Analysis:  INTERVENTIONS THIS SESSION: Manual: assessed PFM and  performed scar release and TP release B to decrease spasm and improve ROM and function .  NM re-ed: educated on and practiced coordination of kegels with exhale and pelvic tilt with cues to decrease accessory muscle use and focus small as well as baloon imagery to improve relaxation.  Therex educated on supine pelvic tilts with pillow under hips to decrease pressure/discomfort and improve recruitment for kegels.  Total time: 60 min.                               PT Short Term Goals - 02/11/19 1504      PT SHORT  TERM GOAL #1   Title  Patient will demonstrate a coordinated contraction, relaxation, and bulge of the pelvic floor muscles to demonstrate functional recruitment and motion and allow for further strengthening.    Baseline  Pt. history suggests PFM spasms and poor cordination    Time  5    Period  Weeks    Status  New    Target Date  03/18/19      PT SHORT TERM GOAL #2   Title  Patient will demonstrate improved pelvic alignment and balance of musculature surrounding the pelvis to facilitate decreased PFM spasms and decrease pelvic pain.    Baseline  L up-slip vs. RLE long, spasms surrounding the pelvis.    Time  5    Period  Weeks    Status  New    Target Date  03/18/19      PT SHORT TERM GOAL #3   Title  Patient will demonstrate HEP x1 in the clinic to demonstrate understanding and proper form to allow for further improvement.    Baseline  Pt. lacks knowledge of therapeutic exercises that can decrease symptoms.    Time  5    Period  Weeks    Target Date  03/18/19        PT Long Term Goals - 02/11/19 1550      PT LONG TERM GOAL #1   Title  Patient will report no episodes of SUI/UUI over the course of the prior two weeks to demonstrate improved functional ability.    Baseline  pt. demonstrates SUI with every cough/sneeze etc, and UUI occasionally with leakage if trying to delay> 10 min.    Time  10    Period  Weeks    Status  New    Target Date  04/22/19      PT LONG TERM GOAL #2   Title  Patient will report no pain with intercourse to demonstrate improved functional ability.    Baseline  Hx. of pain greatest with initial penetration before pregnancy that she required lidocaine cream for.    Time  10    Period  Weeks    Status  New    Target Date  04/22/19      PT LONG TERM GOAL #3   Title  Patient will score at or below 58/300 on the PFDI and 41/300 on the PFIQ to demonstrate a clinically meaningful decrease in disability and distress due to pelvic floor dysfunction.     Baseline  PFDI: 103/300, PFIQ:86/300    Time  10    Period  Weeks    Status  New    Target Date  04/22/19      PT LONG TERM GOAL #4   Title  Patient will score less than or equal to 20% on the  Female NIH-CPSI to demonstrate a reduction in pain, urinary symptoms, and an improved quality of life.    Baseline  Female NIH-CPSI: 28/43 (65%)    Time  10    Period  Weeks    Status  New    Target Date  04/22/19      PT LONG TERM GOAL #5   Title  Patient will report having BM's at least every-other day with consistency between [facility] stool scale 3-5 over the prior week to demonstrate decreased constipation.    Baseline  Pt. is having multiple type 1-2 BM's daily    Time  10    Period  Weeks    Status  New    Target Date  04/22/19            Plan - 03/25/19 0902    Clinical Impression Statement  Pt. Responded well to all interventions today, demonstrating improved PFM coordination and recruitment, decreased tenderness with pressure, as well as understanding and correct performance of all education and exercises provided today. They will continue to benefit from skilled physical therapy to work toward remaining goals and maximize function as well as decrease likelihood of symptom increase or recurrence.     PT Next Visit Plan  assess gait and give standing, and sittingposture education. strengthen for imbalances at hips, give scoliosis exercises..    PT Home Exercise Plan  breathing, soda-can and sit-to-stand, standing hip-flexor stretch, L heel-lift, tennis ball release, seated pelvic tilts, bridges, supine clam shells, side-stretch, MET correction for R anterior rotation, seated posture, chin-tucks and scapular retractions, R side-down counter-top plank, posture taping    Consulted and Agree with Plan of Care  Patient       Patient will benefit from skilled therapeutic intervention in order to improve the following deficits and impairments:     Visit Diagnosis: Other muscle  spasm  Abnormal posture  Sacrococcygeal disorders, not elsewhere classified     Problem List Patient Active Problem List   Diagnosis Date Noted  . Labor and delivery indication for care or intervention 12/14/2018  . Mastalgia 07/06/2016   Willa Rough DPT, ATC Willa Rough 03/25/2019, 9:15 AM  [place] MAIN [facility] SERVICES [address] [place], [place], [address] Phone: [phone]   Fax:  [phone]  Name: Savannah Mills MRN: [id number] Date of Birth: 1995/06/22

## 2019-03-31 ENCOUNTER — Ambulatory Visit: Payer: Managed Care, Other (non HMO) | Attending: Obstetrics and Gynecology

## 2019-03-31 ENCOUNTER — Other Ambulatory Visit: Payer: Self-pay

## 2019-03-31 DIAGNOSIS — M533 Sacrococcygeal disorders, not elsewhere classified: Secondary | ICD-10-CM | POA: Diagnosis present

## 2019-03-31 DIAGNOSIS — R293 Abnormal posture: Secondary | ICD-10-CM | POA: Insufficient documentation

## 2019-03-31 DIAGNOSIS — M62838 Other muscle spasm: Secondary | ICD-10-CM | POA: Diagnosis not present

## 2019-03-31 NOTE — Patient Instructions (Signed)
Toileting Techniques for Bowel Movements (Defecation) Using your belly (abdomen) and pelvic floor muscles to have a bowel movement is usually instinctive.  Sometimes people can have problems with these muscles and have to relearn proper defecation (emptying) techniques.  If you have weakness in your muscles, organs that are falling out, decreased sensation in your pelvis, or ignore your urge to go, you may find yourself straining to have a bowel movement.  You are straining if you are: . holding your breath or taking in a huge gulp of air and holding it  . keeping your lips and jaw tensed and closed tightly . turning red in the face because of excessive pushing or forcing . developing or worsening your  hemorrhoids . getting faint while pushing . not emptying completely and have to defecate many times a day  If you are straining, you are actually making it harder for yourself to have a bowel movement.  Many people find they are pulling up with the pelvic floor muscles and closing off instead of opening the anus. Due to lack pelvic floor relaxation and coordination the abdominal muscles, one has to work harder to push the feces out.  Many people have never been taught how to defecate efficiently and effectively.  Notice what happens to your body when you are having a bowel movement.  While you are sitting on the toilet pay attention to the following areas: . Jaw and mouth position . Angle of your hips   . Whether your feet touch the ground or not . Arm placement  . Spine position . Waist . Belly tension . Anus (opening of the anal canal)  An Evacuation/Defecation Plan   Here are the 4 basic points:  1. Lean forward enough for your elbows to rest on your knees 2. Support your feet on the floor or use a low stool if your feet don't touch the floor  3. Push out your belly as if you have swallowed a beach ball-you should feel a widening of your waist 4. Open and relax your pelvic floor muscles,  rather than tightening around the anus       The following conditions my require modifications to your toileting posture:  . If you have had surgery in the past that limits your back, hip, pelvic, knee or ankle flexibility . Constipation   Your healthcare practitioner may make the following additional suggestions and adjustments:  1) Sit on the toilet  a) Make sure your feet are supported. b) Notice your hip angle and spine position-most people find it effective to lean forward or raise their knees, which can help the muscles around the anus to relax  c) When you lean forward, place your forearms on your thighs for support  2) Relax suggestions a) Breath deeply in through your nose and out slowly through your mouth as if you are smelling the flowers and blowing out the candles. b) To become aware of how to relax your muscles, contracting and releasing muscles can be helpful.  Pull your pelvic floor muscles in tightly by using the image of holding back gas, or closing around the anus (visualize making a circle smaller) and lifting the anus up and in.  Then release the muscles and your anus should drop down and feel open. Repeat 5 times ending with the feeling of relaxation. c) Keep your pelvic floor muscles relaxed; let your belly bulge out. d) The digestive tract starts at the mouth and ends at the anal opening, so   be sure to relax both ends of the tube.  Place your tongue on the roof of your mouth with your teeth separated.  This helps relax your mouth and will help to relax the anus at the same time.  3) Empty (defecation) a) Keep your pelvic floor and sphincter relaxed, then bulge your anal muscles.  Make the anal opening wide.  b) Stick your belly out as if you have swallowed a beach ball. c) Make your belly wall hard using your belly muscles while continuing to breathe. Doing this makes it easier to open your anus. d) Breath out and give a grunt (or try using other sounds such as  ahhhh, shhhhh, ohhhh or grrrrrrr).   CHEST: Doorway, Bilateral - Standing   Both arms if narrow door                        door  Standing in doorway, place hands and elbows on wall with elbows bent at shoulder height, shoulders down away from ears. Lean forward. Hold for_5_ breaths, repeat 2-3 times, 1-2 times per day.    Unilateral option with head turn Standing in doorway, place hands on wall with elbows bent at shoulder height. Lean forward. Feel stretch through the chest and neck.  Hold for_5_ breaths, repeat 2-3 times, 1-2 times per day.  4) Finish a) As you finish your bowel movement, pull the pelvic floor muscles up and in.  This will leave your anus in the proper place rather than remaining pushed out and down. If you leave your anus pushed out and down, it will start to feel as though that is normal and give you incorrect signals about needing to have a bowel movement.    Do 3 sets of 10 but stop if your form slips. Exhale as you go back.    Inhale as you lower down gradually releasing the glutes and the lower tummy muscles. As you exhale, gradually increase the tension in the glutes and lower tummy until you reach the top and give an "extra squeeze" at the top. Repeat 10x3__, _1-2__ Times per day.  Hip Abduction (Standing)    Stand with support. Exhale to squeeze deep core and hold. Start with leg to the side/back on your toe and gently squeeze the outer hip to lift the toe off of the ground. Hold for 1 second then repeat. You should feel this in the outer hip, not the front, not your side,  And not your back. Repeat __10_ times. Do _3__ sets  a day.   You can discontinue clam-shells and bridges.

## 2019-03-31 NOTE — Therapy (Signed)
[place] MAIN [facility] SERVICES [address] [place], [place], [address] Phone: [phone]   Fax:  [phone]  Physical Therapy Treatment  The patient has been informed of current processes in place at Outpatient Rehab to protect patients from Covid-19 exposure including social distancing, schedule modifications, and new cleaning procedures. After discussing their particular risk with a therapist based on the patient's personal risk factors, the patient has decided to proceed with in-person therapy.   Patient Details  Name: Savannah Mills MRN: [id number] Date of Birth: 07-15-1995 No data recorded  Encounter Date: 03/31/2019  PT End of Session - 03/31/19 0839    Visit Number  8    Number of Visits  10    Date for PT Re-Evaluation  04/22/19    Authorization Type  cigna    Authorization Time Period  from 02/11/2019 to 04/22/2019    Authorization - Visit Number  8    Authorization - Number of Visits  10    PT Start Time  0730    PT Stop Time  0830    PT Time Calculation (min)  60 min    Activity Tolerance  Patient tolerated treatment well;No increased pain    Behavior During Therapy  WFL for tasks assessed/performed       Past Medical History:  Diagnosis Date  . Anemia   . Anxiety   . Bilateral breast cysts   . IBS (irritable bowel syndrome)     Past Surgical History:  Procedure Laterality Date  . COLONOSCOPY  6 years  . ingrown toenail excision    . MINOR HEMORRHOIDECTOMY    . UPPER GI ENDOSCOPY  6 years ago  . WISDOM TOOTH EXTRACTION      There were no vitals filed for this visit.    Pelvic Floor Physical Therapy Treatment Note  SCREENING  Changes in medications, allergies, or medical history?: none    SUBJECTIVE  Patient reports: Has a lot less pain with BM, some still present but does not need medication anymore. Still having to strain some, finds that she is only having pain externally with intercourse, not  internally. Bridges and clam shells are getting easy.  Precautions:  IBS, h/o anxiety but has been coping well with both since being with her husband  Pain update: Location of pain: LBP Current pain: 0/10 Max pain: 4/10 (just a couple minutes) Least pain: 0/10  Nature of pain:sharp   Patient Goals: Having more control over her bladder, not have pain or pressure with daily activities and be able to have intercourse without pain.    OBJECTIVE  Changes in: Posture/Observations:  Very slight L ilium high, L pubic ramus higher than L. (from 03-25-19)  Range of Motion/Flexibilty:   Strength/MMT:  Pt. Has difficulty recruiting R>L hip abductors and her low back and QL like to compensate for glute weakness, requires mod. Visual, tactile, and verbal cueing.  Pelvic Floor External Exam: (from 03-25-19) Introitus Appears: gaping Skin integrity: scar tissue visible on L>R Palpation: TTP to L>R IC and STP Cough: not assessed Prolapse visible?: yes, both anterior and posterior walls at ~ 1/2 cm above introitus with bearing down. Scar mobility: decreased mobility on L>R  Internal Vaginal Exam: Strength (PERF): ~2+/5 pre-treatment, ~ 4/5 post-treatment Symmetry: greater spasms on L>R Palpation: TTP to coccygeus and anterior PR as well as PR at introitus on R, all areas on L Prolapse: yes, both anterior and posterior walls at ~ 1/2 cm above introitus with  bearing down   Abdominal:  Pt. Requires min. Cueing to use belly breathing, has some restriction in ability to expand ribs. (not reassessed today)  Palpation: TTP to B adductors -decreased following treatment.  Gait Analysis:  INTERVENTIONS THIS SESSION: Manual: Performed TP release to B adductors to decrease spasm and pain and allow for improved balance of musculature for improved function and decreased symptoms.  NM re-ed: Placed kinesio-tape in an x pattern on upper back to improve proprioception and improve posture  and balance of musculature.  Self-care: educated on squatty-potty, self splinting with her thumb, toileting techniques, and doing "quick-fix" pelvic tilts with hips elevated following BM to decrease straining and pressure as well as pain following BM.  Therex: educated on and practiced horizontal abduction with yellow band, chest-stretch in doorway, kneeling hip-hinges, and standing hip Abduction to improve strength of muscles opposing tight musculature to allow reciprocal inhibition to decrease spasms and to improve balance of musculature surrounding the pelvis and improve overall posture for optimal musculature length-tension relationship and function.  Total time: 60 min.                            PT Short Term Goals - 02/11/19 1504      PT SHORT TERM GOAL #1   Title  Patient will demonstrate a coordinated contraction, relaxation, and bulge of the pelvic floor muscles to demonstrate functional recruitment and motion and allow for further strengthening.    Baseline  Pt. history suggests PFM spasms and poor cordination    Time  5    Period  Weeks    Status  New    Target Date  03/18/19      PT SHORT TERM GOAL #2   Title  Patient will demonstrate improved pelvic alignment and balance of musculature surrounding the pelvis to facilitate decreased PFM spasms and decrease pelvic pain.    Baseline  L up-slip vs. RLE long, spasms surrounding the pelvis.    Time  5    Period  Weeks    Status  New    Target Date  03/18/19      PT SHORT TERM GOAL #3   Title  Patient will demonstrate HEP x1 in the clinic to demonstrate understanding and proper form to allow for further improvement.    Baseline  Pt. lacks knowledge of therapeutic exercises that can decrease symptoms.    Time  5    Period  Weeks    Target Date  03/18/19        PT Long Term Goals - 02/11/19 1550      PT LONG TERM GOAL #1   Title  Patient will report no episodes of SUI/UUI over the course of the  prior two weeks to demonstrate improved functional ability.    Baseline  pt. demonstrates SUI with every cough/sneeze etc, and UUI occasionally with leakage if trying to delay> 10 min.    Time  10    Period  Weeks    Status  New    Target Date  04/22/19      PT LONG TERM GOAL #2   Title  Patient will report no pain with intercourse to demonstrate improved functional ability.    Baseline  Hx. of pain greatest with initial penetration before pregnancy that she required lidocaine cream for.    Time  10    Period  Weeks    Status  New    Target  Date  04/22/19      PT LONG TERM GOAL #3   Title  Patient will score at or below 58/300 on the PFDI and 41/300 on the PFIQ to demonstrate a clinically meaningful decrease in disability and distress due to pelvic floor dysfunction.    Baseline  PFDI: 103/300, PFIQ:86/300    Time  10    Period  Weeks    Status  New    Target Date  04/22/19      PT LONG TERM GOAL #4   Title  Patient will score less than or equal to 20% on the Female NIH-CPSI to demonstrate a reduction in pain, urinary symptoms, and an improved quality of life.    Baseline  Female NIH-CPSI: 28/43 (65%)    Time  10    Period  Weeks    Status  New    Target Date  04/22/19      PT LONG TERM GOAL #5   Title  Patient will report having BM's at least every-other day with consistency between [facility] stool scale 3-5 over the prior week to demonstrate decreased constipation.    Baseline  Pt. is having multiple type 1-2 BM's daily    Time  10    Period  Weeks    Status  New    Target Date  04/22/19            Plan - 03/31/19 0839    Clinical Impression Statement  Pt. Responded well to all interventions today, demonstrating improved proprioception and muscular recruitment, decreased spasms, as well as understanding and correct performance of all education and exercises provided today. They will continue to benefit from skilled physical therapy to work toward remaining goals and  maximize function as well as decrease likelihood of symptom increase or recurrence.     PT Next Visit Plan  assess gait and give standing, and sittingposture education. Review HEP, re-assess PFM at introitus and posterior PFM and check coordination, give scoliosis exercises..    PT Home Exercise Plan  breathing, soda-can and sit-to-stand, standing hip-flexor stretch, L heel-lift, tennis ball release, seated pelvic tilts, side-stretch, MET correction for R anterior rotation, seated posture, chin-tucks and scapular retractions, R side-down counter-top plank, posture taping, horizontal ABD, chest stretch, tall kneeling hip-hinge, standing hip ABD, toileting, splinting, and squatty potty    Consulted and Agree with Plan of Care  Patient       Patient will benefit from skilled therapeutic intervention in order to improve the following deficits and impairments:     Visit Diagnosis: Other muscle spasm  Abnormal posture  Sacrococcygeal disorders, not elsewhere classified     Problem List Patient Active Problem List   Diagnosis Date Noted  . Labor and delivery indication for care or intervention 12/14/2018  . Mastalgia 07/06/2016   Willa Rough DPT, ATC Willa Rough 03/31/2019, 8:42 AM  [place] MAIN [facility] SERVICES [address] [place], [place], [address] Phone: [phone]   Fax:  [phone]  Name: Savannah Mills MRN: [id number] Date of Birth: 1995-12-21

## 2019-04-07 ENCOUNTER — Ambulatory Visit: Payer: Managed Care, Other (non HMO)

## 2019-04-07 ENCOUNTER — Other Ambulatory Visit: Payer: Self-pay

## 2019-04-07 DIAGNOSIS — R293 Abnormal posture: Secondary | ICD-10-CM

## 2019-04-07 DIAGNOSIS — M62838 Other muscle spasm: Secondary | ICD-10-CM

## 2019-04-07 DIAGNOSIS — M533 Sacrococcygeal disorders, not elsewhere classified: Secondary | ICD-10-CM

## 2019-04-07 NOTE — Therapy (Signed)
[place] [facility] MAIN [facility] SERVICES [address] [place], [place], [address] Phone: [phone]   Fax:  [phone]  Physical Therapy Treatment  The patient has been informed of current processes in place at Outpatient Rehab to protect patients from Covid-19 exposure including social distancing, schedule modifications, and new cleaning procedures. After discussing their particular risk with a therapist based on the patient's personal risk factors, the patient has decided to proceed with in-person therapy.   Patient Details  Name: Savannah Mills MRN: [id number] Date of Birth: Sep 16, 1995 No data recorded  Encounter Date: 04/07/2019  PT End of Session - 04/07/19 1006    Visit Number  9    Number of Visits  10    Date for PT Re-Evaluation  04/22/19    Authorization Type  cigna    Authorization Time Period  from 02/11/2019 to 04/22/2019    Authorization - Visit Number  9    Authorization - Number of Visits  10    PT Start Time  0730    PT Stop Time  0830    PT Time Calculation (min)  60 min    Activity Tolerance  Patient tolerated treatment well;No increased pain    Behavior During Therapy  WFL for tasks assessed/performed       Past Medical History:  Diagnosis Date  . Anemia   . Anxiety   . Bilateral breast cysts   . IBS (irritable bowel syndrome)     Past Surgical History:  Procedure Laterality Date  . COLONOSCOPY  6 years  . ingrown toenail excision    . MINOR HEMORRHOIDECTOMY    . UPPER GI ENDOSCOPY  6 years ago  . WISDOM TOOTH EXTRACTION      There were no vitals filed for this visit.    Pelvic Floor Physical Therapy Treatment Note  SCREENING  Changes in medications, allergies, or medical history?: none    SUBJECTIVE  Patient reports: Has been really busy at work, baby is teething, has not done her exercises. Still feels that it is getting better even without doing her exercises much feels like the tape was good. Kept  it on 2 days. No leakage or feeling of pressure, no pain with intercourse.  Precautions:  IBS, h/o anxiety but has been coping well with both since being with her husband  Pain update: Location of pain: LBP Current pain: 0/10 Max pain: 0/10  Least pain: 0/10  Nature of pain:sharp   Patient Goals: Having more control over her bladder, not have pain or pressure with daily activities and be able to have intercourse without pain.    OBJECTIVE  Changes in: Posture/Observations:  Very slight L ilium high, L pubic ramus higher than L. (from 03-25-19)  Range of Motion/Flexibilty:   Strength/MMT:  Pt. Has difficulty recruiting R>L hip abductors and her low back and QL like to compensate for glute weakness, requires mod. Visual, tactile, and verbal cueing.  Pelvic Floor External Exam: (from 03-25-19) Introitus Appears: gaping Skin integrity: scar tissue visible on L>R Palpation: TTP to L>R IC and STP Cough: not assessed Prolapse visible?: yes, both anterior and posterior walls at ~ 1/2 cm above introitus with bearing down. Scar mobility: decreased mobility on L>R  Internal Vaginal Exam: (from prior treatment, not re-assessed today) Strength (PERF): ~2+/5 pre-treatment, ~ 4/5 post-treatment Symmetry: greater spasms on L>R Palpation: TTP to coccygeus and anterior PR as well as PR at introitus on R, all areas on L Prolapse: yes, both  anterior and posterior walls at ~ 1/2 cm above introitus with bearing down   Abdominal:  Pt. Requires min. Cueing to use belly breathing, has some restriction in ability to expand ribs. (not reassessed today)  Palpation: TTP to B adductors -decreased following treatment.  Gait Analysis: Trendelenburg on R, over-pronation/hip IR on L   INTERVENTIONS THIS SESSION: Manual: Performed TP release to Paraspinals/multifidus along R ~ T8-10 followed by grade 3-4 rotational mobs from R to L at the spinous processes of T8-10 to decrease spasm and  pain and allow for improved balance of musculature for improved function and decreased symptoms and to improve mobility of joint and surrounding connective tissue and decrease pressure on nerve roots for improved conductivity and function of down-stream tissues.   NM re-ed: practiced SLS B 2x15 sec. Holds with eyes open then closed and educated on proprioception and the need to improve her body awareness and decrease knee hyperextension, improve deep-core recruitment to maintain improvement in pain and bladder function. Educated on standing posture and walking posture with ability to correct by ~ 50% following instruction alone.  Therex: educated on and practiced toe scrunches and arch crunches 2x10 with mod to min cueing to improve intrinsic foot strength and recruitment to decrease over-pronation and L hip internal rotation. Practiced clam-shells with Red band and renewed focus of timing of deep core then hip to prevent compensation and strengthen hip ER's on L>R to improve gait mechanics. Educated on arch supports to help support the kinetic chain from the bottom as well.  Total time: 60 min.                              PT Short Term Goals - 02/11/19 1504      PT SHORT TERM GOAL #1   Title  Patient will demonstrate a coordinated contraction, relaxation, and bulge of the pelvic floor muscles to demonstrate functional recruitment and motion and allow for further strengthening.    Baseline  Pt. history suggests PFM spasms and poor cordination    Time  5    Period  Weeks    Status  New    Target Date  03/18/19      PT SHORT TERM GOAL #2   Title  Patient will demonstrate improved pelvic alignment and balance of musculature surrounding the pelvis to facilitate decreased PFM spasms and decrease pelvic pain.    Baseline  L up-slip vs. RLE long, spasms surrounding the pelvis.    Time  5    Period  Weeks    Status  New    Target Date  03/18/19      PT SHORT TERM GOAL  #3   Title  Patient will demonstrate HEP x1 in the clinic to demonstrate understanding and proper form to allow for further improvement.    Baseline  Pt. lacks knowledge of therapeutic exercises that can decrease symptoms.    Time  5    Period  Weeks    Target Date  03/18/19        PT Long Term Goals - 02/11/19 1550      PT LONG TERM GOAL #1   Title  Patient will report no episodes of SUI/UUI over the course of the prior two weeks to demonstrate improved functional ability.    Baseline  pt. demonstrates SUI with every cough/sneeze etc, and UUI occasionally with leakage if trying to delay> 10 min.    Time  10    Period  Weeks    Status  New    Target Date  04/22/19      PT LONG TERM GOAL #2   Title  Patient will report no pain with intercourse to demonstrate improved functional ability.    Baseline  Hx. of pain greatest with initial penetration before pregnancy that she required lidocaine cream for.    Time  10    Period  Weeks    Status  New    Target Date  04/22/19      PT LONG TERM GOAL #3   Title  Patient will score at or below 58/300 on the PFDI and 41/300 on the PFIQ to demonstrate a clinically meaningful decrease in disability and distress due to pelvic floor dysfunction.    Baseline  PFDI: 103/300, PFIQ:86/300    Time  10    Period  Weeks    Status  New    Target Date  04/22/19      PT LONG TERM GOAL #4   Title  Patient will score less than or equal to 20% on the Female NIH-CPSI to demonstrate a reduction in pain, urinary symptoms, and an improved quality of life.    Baseline  Female NIH-CPSI: 28/43 (65%)    Time  10    Period  Weeks    Status  New    Target Date  04/22/19      PT LONG TERM GOAL #5   Title  Patient will report having BM's at least every-other day with consistency between [facility] stool scale 3-5 over the prior week to demonstrate decreased constipation.    Baseline  Pt. is having multiple type 1-2 BM's daily    Time  10    Period  Weeks     Status  New    Target Date  04/22/19            Plan - 04/07/19 1007    Clinical Impression Statement  Pt. Responded well to all interventions today, demonstrating improved gait mechanics, improved glute med recruitment and decreased LB overcompensation, improved R rotation ROM with decreased discomfort, as well as understanding and correct performance of all education and exercises provided today. They will continue to benefit from skilled physical therapy to work toward remaining goals and maximize function as well as decrease likelihood of symptom increase or recurrence.     PT Next Visit Plan  review gait, balance with eyes closed (give in handout!) and give sitting posture education. Review HEP, re-assess PFM at introitus and posterior PFM PRN?    PT Home Exercise Plan  breathing, soda-can and sit-to-stand, standing hip-flexor stretch, L heel-lift, tennis ball release, seated pelvic tilts, side-stretch, clam-shells with eccentric focus and Red band, seated posture, chin-tucks and scapular retractions, R side-down counter-top plank, posture taping, horizontal ABD, chest stretch, tall kneeling hip-hinge, standing hip ABD, toileting, splinting, and squatty potty, arch crunches, bow-and-arrow to R only with stretch.    Consulted and Agree with Plan of Care  Patient       Patient will benefit from skilled therapeutic intervention in order to improve the following deficits and impairments:     Visit Diagnosis: Other muscle spasm  Abnormal posture  Sacrococcygeal disorders, not elsewhere classified     Problem List Patient Active Problem List   Diagnosis Date Noted  . Labor and delivery indication for care or intervention 12/14/2018  . Mastalgia 07/06/2016   Cleophus Molt DPT, ATC Cleophus Molt  04/07/2019, 10:11 AM  [place] MAIN [facility] SERVICES [address] [place], [place], [address] Phone: [phone]   Fax:  [phone]  Name:  Savannah Mills MRN: [id number] Date of Birth: 08/06/1995

## 2019-04-07 NOTE — Patient Instructions (Signed)
   Let the top arm rest on your side, and slide along the torso as you rotate. Breathe in as you come forward, out as you open up. Do 2x10 with right side-up. Follow this with a 2x 5 breath hold to stretch the muscles.       Hold for 5 deep breaths with the knee straight then 5 breaths with the knee slightly bent do this twice for each side.   Keep the toes flat on the ground and pull the arch up then fully relax. Do 3x10 once a day.  [email or web address]  [email or web address]

## 2019-04-14 ENCOUNTER — Ambulatory Visit: Payer: Managed Care, Other (non HMO)

## 2019-04-14 ENCOUNTER — Other Ambulatory Visit: Payer: Self-pay

## 2019-04-14 DIAGNOSIS — M62838 Other muscle spasm: Secondary | ICD-10-CM | POA: Diagnosis not present

## 2019-04-14 DIAGNOSIS — M533 Sacrococcygeal disorders, not elsewhere classified: Secondary | ICD-10-CM

## 2019-04-14 DIAGNOSIS — R293 Abnormal posture: Secondary | ICD-10-CM

## 2019-04-14 NOTE — Therapy (Signed)
[place] MAIN [facility] SERVICES [address] [place], [place], [address] Phone: [phone]   Fax:  [phone]  Physical Therapy Treatment  The patient has been informed of current processes in place at Outpatient Rehab to protect patients from Covid-19 exposure including social distancing, schedule modifications, and new cleaning procedures. After discussing their particular risk with a therapist based on the patient's personal risk factors, the patient has decided to proceed with in-person therapy.   Patient Details  Name: Savannah Mills MRN: [id number] Date of Birth: 01/06/1996 No data recorded  Encounter Date: 04/14/2019  PT End of Session - 04/15/19 1327    Visit Number  10    Number of Visits  10    Date for PT Re-Evaluation  04/22/19    Authorization Type  cigna    Authorization Time Period  from 02/11/2019 to 04/22/2019    Authorization - Visit Number  10    Authorization - Number of Visits  10    PT Start Time  0735    PT Stop Time  0825    PT Time Calculation (min)  50 min    Activity Tolerance  Patient tolerated treatment well;No increased pain    Behavior During Therapy  WFL for tasks assessed/performed       Past Medical History:  Diagnosis Date  . Anemia   . Anxiety   . Bilateral breast cysts   . IBS (irritable bowel syndrome)     Past Surgical History:  Procedure Laterality Date  . COLONOSCOPY  6 years  . ingrown toenail excision    . MINOR HEMORRHOIDECTOMY    . UPPER GI ENDOSCOPY  6 years ago  . WISDOM TOOTH EXTRACTION      There were no vitals filed for this visit.    Pelvic Floor Physical Therapy Treatment Note  SCREENING  Changes in medications, allergies, or medical history?: none    SUBJECTIVE  Patient reports: Has not had pain with BM's this week, has not had to sit for very long. Has been on her period so she has had normal period back and cramp pain. Remembers to use good posture ~ 25% of  the time due to reduced awareness.   Precautions:  IBS, h/o anxiety but has been coping well with both since being with her husband  Pain update: Location of pain: LBP Current pain: 0/10 Max pain: 7/10 [place] for first 2 days of her period.  Least pain: 0/10  Nature of pain:sharp   Patient Goals: Having more control over her bladder, not have pain or pressure with daily activities and be able to have intercourse without pain.    OBJECTIVE  Changes in: Posture/Observations:  Very slight L ilium high, L pubic ramus higher than L. (from 03-25-19)  RLE long in both, R ASIS low in supine (R anterior   Range of Motion/Flexibilty:   Strength/MMT:  Pt. Has difficulty recruiting R>L hip abductors and her low back and QL like to compensate for glute weakness, requires mod. Visual, tactile, and verbal cueing.  Pelvic Floor External Exam: (from 03-25-19) Introitus Appears: gaping Skin integrity: scar tissue visible on L>R Palpation: TTP to L>R IC and STP Cough: not assessed Prolapse visible?: yes, both anterior and posterior walls at ~ 1/2 cm above introitus with bearing down. Scar mobility: decreased mobility on L>R  Internal Vaginal Exam: (from prior treatment, not re-assessed today) Strength (PERF): ~2+/5 pre-treatment, ~ 4/5 post-treatment Symmetry: greater spasms on L>R Palpation:  TTP to coccygeus and anterior PR as well as PR at introitus on R, all areas on L Prolapse: yes, both anterior and posterior walls at ~ 1/2 cm above introitus with bearing down   Abdominal:  Pt. Requires min. Cueing to use belly breathing, has some restriction in ability to expand ribs. (not reassessed today)  Palpation: TTP to B sub-occipitals on   Gait Analysis: Trendelenburg on R, over-pronation/hip IR on L   INTERVENTIONS THIS SESSION: Manual: Performed TP release to L sub-occipitals to decrease spasm, improve ROM, and allow for chin-tuck in seated to improve  posture.  Therex: Reviewed and revised HEP, split into A and B days to improve compliance and D/C'ed unnecessary exercises, added tea-pot to improve upper portion of scoliotic curve and up-graded hip-hinges to chair squats to increase challenge for increased strength and improve functional muscle pattern.   Self-care: Instructed to use posture taping for 2 weeks to improve proprioception and retrain habit to allow for long-term improvement and maintenance of Sx. Reduction.  Total time: 50 min.                           PT Short Term Goals - 04/15/19 1329      PT SHORT TERM GOAL #1   Title  Patient will demonstrate a coordinated contraction, relaxation, and bulge of the pelvic floor muscles to demonstrate functional recruitment and motion and allow for further strengthening.    Baseline  Pt. history suggests PFM spasms and poor cordination    Time  5    Period  Weeks    Status  Achieved    Target Date  03/18/19      PT SHORT TERM GOAL #2   Title  Patient will demonstrate improved pelvic alignment and balance of musculature surrounding the pelvis to facilitate decreased PFM spasms and decrease pelvic pain.    Baseline  L up-slip vs. RLE long, spasms surrounding the pelvis.    Time  5    Period  Weeks    Status  Achieved    Target Date  03/18/19      PT SHORT TERM GOAL #3   Title  Patient will demonstrate HEP x1 in the clinic to demonstrate understanding and proper form to allow for further improvement.    Baseline  Pt. lacks knowledge of therapeutic exercises that can decrease symptoms.    Time  5    Period  Weeks    Status  Achieved    Target Date  03/18/19        PT Long Term Goals - 04/15/19 1331      PT LONG TERM GOAL #1   Title  Patient will report no episodes of SUI/UUI over the course of the prior two weeks to demonstrate improved functional ability.    Baseline  pt. demonstrates SUI with every cough/sneeze etc, and UUI occasionally with leakage  if trying to delay> 10 min.    Time  10    Period  Weeks    Status  Achieved    Target Date  04/22/19      PT LONG TERM GOAL #2   Title  Patient will report no pain with intercourse to demonstrate improved functional ability.    Baseline  Hx. of pain greatest with initial penetration before pregnancy that she required lidocaine cream for.    Time  10    Period  Weeks    Status  Achieved  Target Date  04/22/19      PT LONG TERM GOAL #3   Title  Patient will score at or below 58/300 on the PFDI and 41/300 on the PFIQ to demonstrate a clinically meaningful decrease in disability and distress due to pelvic floor dysfunction.    Baseline  PFDI: 103/300, PFIQ:86/300    Time  10    Period  Weeks    Status  Unable to assess    Target Date  04/22/19      PT LONG TERM GOAL #4   Title  Patient will score less than or equal to 20% on the Female NIH-CPSI to demonstrate a reduction in pain, urinary symptoms, and an improved quality of life.    Baseline  Female NIH-CPSI: 28/43 (65%)    Time  10    Period  Weeks    Status  Unable to assess    Target Date  04/22/19      PT LONG TERM GOAL #5   Title  Patient will report having BM's at least every-other day with consistency between [facility] stool scale 3-5 over the prior week to demonstrate decreased constipation.    Baseline  Pt. is having multiple type 1-2 BM's daily    Time  10    Period  Weeks    Status  Achieved    Target Date  04/22/19            Plan - 04/15/19 1335    Clinical Impression Statement  Pt. Responded well to all interventions today, demonstrating improved ability to recruit deep cervical extensors and improved HEP perfomance as well as understanding and correct performance of all education and exercises provided today. They will continue to benefit from skilled physical therapy to work toward remaining goals and maximize function as well as decrease likelihood of symptom increase or recurrence.     PT Next Visit Plan   review gait, balance with eyes closed (give in handout!) and give sitting posture education. give outcome measures, re-assess posture and strength and determine if Pt. wishes to continue for further strengthening/prevention Vs. D/C, re-assess PFM at introitus and posterior fourchette if desired.    PT Home Exercise Plan  breathing, soda-can and sit-to-stand, standing hip-flexor stretch, L heel-lift, tennis ball release, seated pelvic tilts, side-stretch, clam-shells with eccentric focus and Red band, seated posture, chin-tucks and scapular retractions, R side-down counter-top plank, posture taping, horizontal ABD, chest stretch, tall kneeling hip-hinge, standing hip ABD, toileting, splinting, and squatty potty, arch crunches, bow-and-arrow to R only with stretch.    Consulted and Agree with Plan of Care  Patient       Patient will benefit from skilled therapeutic intervention in order to improve the following deficits and impairments:     Visit Diagnosis: Other muscle spasm  Abnormal posture  Sacrococcygeal disorders, not elsewhere classified     Problem List Patient Active Problem List   Diagnosis Date Noted  . Labor and delivery indication for care or intervention 12/14/2018  . Mastalgia 07/06/2016   Cleophus Molt DPT, ATC Cleophus Molt 04/15/2019, 1:42 PM  [place] [facility] MAIN [facility] SERVICES [address] [place], [place], [address] Phone: [phone]   Fax:  [phone]  Name: Savannah Mills MRN: [id number] Date of Birth: Aug 04, 1995

## 2019-04-14 NOTE — Patient Instructions (Signed)
"  A" day: [place], horizontal ABD with red band, chest stretch,  SLS balance with eyes closed, arch crunches, toe scrunches (2 weeks)     "B" day: Standing hip-flexor stretch,clam-shells with eccentric focus and Red band, chair squats, standing hip ABD,   Daily: chin-tucks and scapular retractions, posture taping (2 weeks), bow-and-arrow to L  1x10 and 5 breath hold, R side-down counter-top plank, side-stretch  PRN: tennis ball release,   Continue for 1-2 months, use as needed following that time period.

## 2019-04-21 ENCOUNTER — Ambulatory Visit: Payer: Managed Care, Other (non HMO)

## 2019-04-21 ENCOUNTER — Other Ambulatory Visit: Payer: Self-pay

## 2019-04-21 DIAGNOSIS — M533 Sacrococcygeal disorders, not elsewhere classified: Secondary | ICD-10-CM

## 2019-04-21 DIAGNOSIS — M62838 Other muscle spasm: Secondary | ICD-10-CM | POA: Diagnosis not present

## 2019-04-21 DIAGNOSIS — R293 Abnormal posture: Secondary | ICD-10-CM

## 2019-04-21 NOTE — Patient Instructions (Signed)
EXhale on EXertion!!!!! (pushing, pulling, lifting, standing, etc.)  Whenever you exert force you need to exhale to allow the pressure to escape out of your vocal cords rather than be pressed down through your pelvic floor. Exhaling also helps engage your deep-core muscles to protect your back and pelvic floor.  "QUICK FIX" for bulge sensation:  Pelvic Tilt With Pelvic Floor (Hook-Lying)        Lie with hips and knees bent. Squeeze pelvic floor and flatten low back while breathing out so that pelvis tilts. Repeat ___ times. Do ___ times a day.  Put a pillow under your hips in this position when doing this exercise to help decrease pressure in the pelvis when it feels "heavy".  Kegel exercises:    With neutral spine, tighten pelvic floor by imagining you are stopping the flow of urine, squeezing only around the vagina and anus.  Quick-Flicks: Pull up and in quickly and then relax allowing just enough time for the muscles to full lengthen before the next contraction. Do _10__ repetitions in a row, stopping if the pelvic floor muscle gets tired and other muscles try to take over.  Long-Holds: Hold for _5-10__ seconds and then fully release, repeat _10__ times. (only hold until you can feel the muscles "give up") (work your way up to 10 sec. 10 times.)   Repeat both of these exercises _3-5__ times throughout the day   (back down to 1x/day time after 2 months)  If this is difficult, start by performing while lying down as shown. As you get stronger, you can try in sitting, standing, etc. Make sure that you are NOT contracting your buttocks or inner thigh muscles!  Tip: If you are having a hard time feeling the muscles, try using a hand-held mirror to observe the muscles lift and draw in or feel with your hand to make sure you are squeezing when you think you are squeezing, not bearing down. Some people find it helpful to pretend you are "picking blueberries" with the vagina to feel the  drawing in or try to make the clitoris "nod" and pull your sitz bones in toward the middle.    Pelvic Rotation: Contract / Relax (Supine)  MET to Correct Right Anteriorly Rotated/Left Posteriorly Rotated Innominate   Begin laying on your back with your feet at 90 degrees. Put a dowel/broomstick  through your legs, behind your right knee and in front of your left knee. Stabilize the dowel on ether side with your hands.  Press down with the right leg and up with the left leg. Hold for 5 seconds  then slowly relax. Repeat 5 times.     Do this with a "leather" belt around your knees to press into. Exhale and Press put and hold for 5 sec, then release. Repeat x5.     Exhale and squeeze a small ball, pillow, etc. Between your knees and hold for 5 sec. Repeat 5 times.   REPEAT THE ABOVE # EXERCISES IN SEQUENCE 2 TIMES. Do this once per week.   Start on your side, make sure that your knees, your hips, and your shoulders are in a straight line. Bring your hips up off of the  Hold for ~ 30 seconds, (or until you feel like you are too tired to hold it correctly). Repeat 2-3 times (up to ~ 1:30 sec. Total).   As you get stronger, you can lengthen each hold. When you can hold for ~ 1:30 straight, try switching to on  your feet instead of knees. You will need to decrease your hold time initially and build back up to the full 1:30.    Let the top arm rest on your side, and slide along the torso as you rotate. Breathe in as you come forward, out as you open up. Do 2x15 on each side.    Hold for 30 seconds (5 deep breaths) and repeat 2-3 times on each side once a day   AND "TEAPOT"   DO these AT LEAST 3 times per week indefinitely to manage scoliosis.      Make sure that you create a posterior pelvic tilt and stagger your feet either front/back or take a wide stance at the crib-side to decrease strain on your back. EXHALE TO LIFT!

## 2019-04-21 NOTE — Therapy (Signed)
[place] MAIN [facility] SERVICES [address] [place], [place], [address] Phone: [phone]   Fax:  [phone]  Physical Therapy Treatment  The patient has been informed of current processes in place at Outpatient Rehab to protect patients from Covid-19 exposure including social distancing, schedule modifications, and new cleaning procedures. After discussing their particular risk with a therapist based on the patient's personal risk factors, the patient has decided to proceed with in-person therapy.   Patient Details  Name: Savannah Mills MRN: [id number] Date of Birth: Feb 01, 1996 No data recorded  Encounter Date: 04/21/2019  PT End of Session - 04/21/19 1304    Visit Number  11    Number of Visits  10    Date for PT Re-Evaluation  04/22/19    Authorization Type  cigna    Authorization Time Period  from 02/11/2019 to 04/22/2019    Authorization - Visit Number  11    Authorization - Number of Visits  10    PT Start Time  4970    PT Stop Time  0830    PT Time Calculation (min)  55 min    Activity Tolerance  Patient tolerated treatment well;No increased pain    Behavior During Therapy  WFL for tasks assessed/performed       Past Medical History:  Diagnosis Date  . Anemia   . Anxiety   . Bilateral breast cysts   . IBS (irritable bowel syndrome)     Past Surgical History:  Procedure Laterality Date  . COLONOSCOPY  6 years  . ingrown toenail excision    . MINOR HEMORRHOIDECTOMY    . UPPER GI ENDOSCOPY  6 years ago  . WISDOM TOOTH EXTRACTION      There were no vitals filed for this visit.    Pelvic Floor Physical Therapy Treatment Note and Discharge Summary.  SCREENING  Changes in medications, allergies, or medical history?: none    SUBJECTIVE  Patient reports: Has had some more back pain this week because she is sleep training her son, having to lean forward into the crib. Still no pain with BM, sex. Has bulge when  wiping and in the shower but no pressure at neutral.  Precautions:  IBS, h/o anxiety but has been coping well with both since being with her husband.   Pain update: Location of pain: LBP Current pain: 0/10 Max pain: 6/10 (sharp pain for 45 sec. Then a dull pain for 5-10 min.) Least pain: 0/10  Nature of pain:sharp   Patient Goals: Having more control over her bladder, not have pain or pressure with daily activities and be able to have intercourse without pain.    OBJECTIVE  Changes in: Posture/Observations:  R anterior innominate, decreased but still present following treatment.   Range of Motion/Flexibilty:   Strength/MMT:  Pt. Able to provide improved resistance with MET correction on second repetition.  Pelvic Floor External Exam: (from 03-25-19) Introitus Appears: gaping Skin integrity: scar tissue visible on L>R Palpation: TTP to L>R IC and STP Cough: not assessed Prolapse visible?: yes, both anterior and posterior walls at ~ 1/2 cm above introitus with bearing down. Scar mobility: decreased mobility on L>R  Internal Vaginal Exam: (from prior treatment, not re-assessed today) Strength (PERF): ~2+/5 pre-treatment, ~ 4/5 post-treatment Symmetry: greater spasms on L>R Palpation: TTP to coccygeus and anterior PR as well as PR at introitus on R, all areas on L Prolapse: yes, both anterior and posterior walls at ~ 1/2 cm above  introitus with bearing down   Abdominal:  Pt. Requires min. Cueing to use belly breathing, has some restriction in ability to expand ribs. (not reassessed today)  Palpation: TTP to B sub-occipitals on   Gait Analysis:   INTERVENTIONS THIS SESSION: Manual: Performed MET correction X2 for R anterior rotation to improve pelvic alignment, decrease pressure on nerve roots, and allow for prevention of returned spasms and Sx.  Therex: Reviewed HEP and advised on the exercises that she will need to do long-term to help manage scoliosis and  LLD, strengthen deep-core and prevent return of Sx. Educated on how to perform all 3 parts of the MET correction and to do this weekly at least until she gets a regular strengthening routine to help maintain improved aligment.  Self-care: Reviewed goals and decided on POC to D/C at this time. Educated on the importance of following up with finding a pilates-or-similar style exercise routine to allow for increased deep-core and posture strengthening to manage scoliosis and pelvic obliquity.  Total time: 55 min.                            PT Short Term Goals - 04/21/19 1537      PT SHORT TERM GOAL #1   Title  Patient will demonstrate a coordinated contraction, relaxation, and bulge of the pelvic floor muscles to demonstrate functional recruitment and motion and allow for further strengthening.    Baseline  Pt. history suggests PFM spasms and poor cordination    Time  5    Period  Weeks    Status  Achieved    Target Date  03/18/19      PT SHORT TERM GOAL #2   Title  Patient will demonstrate improved pelvic alignment and balance of musculature surrounding the pelvis to facilitate decreased PFM spasms and decrease pelvic pain.    Baseline  L up-slip vs. RLE long, spasms surrounding the pelvis.    Time  5    Period  Weeks    Status  Achieved    Target Date  03/18/19      PT SHORT TERM GOAL #3   Title  Patient will demonstrate HEP x1 in the clinic to demonstrate understanding and proper form to allow for further improvement.    Baseline  Pt. lacks knowledge of therapeutic exercises that can decrease symptoms.    Time  5    Period  Weeks    Status  Achieved    Target Date  03/18/19        PT Long Term Goals - 04/21/19 0739      PT LONG TERM GOAL #1   Title  Patient will report no episodes of SUI/UUI over the course of the prior two weeks to demonstrate improved functional ability.    Baseline  pt. demonstrates SUI with every cough/sneeze etc, and UUI  occasionally with leakage if trying to delay> 10 min.    Time  10    Period  Weeks    Status  Achieved    Target Date  04/22/19      PT LONG TERM GOAL #2   Title  Patient will report no pain with intercourse to demonstrate improved functional ability.    Baseline  Hx. of pain greatest with initial penetration before pregnancy that she required lidocaine cream for.    Time  10    Period  Weeks    Status  Achieved  Target Date  04/22/19      PT LONG TERM GOAL #3   Title  Patient will score at or below 58/300 on the PFDI and 41/300 on the PFIQ to demonstrate a clinically meaningful decrease in disability and distress due to pelvic floor dysfunction.    Baseline  PFDI: 103/300, PFIQ:86/300  As of 1/25: PFDI: 13/300, PFIQ: 0/300    Time  10    Period  Weeks    Status  Achieved    Target Date  04/22/19      PT LONG TERM GOAL #4   Title  Patient will score less than or equal to 20% on the Female NIH-CPSI to demonstrate a reduction in pain, urinary symptoms, and an improved quality of life.    Baseline  Female NIH-CPSI: 28/43 (65%) As of 1/25: 0/43    Time  10    Period  Weeks    Status  Achieved    Target Date  04/22/19      PT LONG TERM GOAL #5   Title  Patient will report having BM's at least every-other day with consistency between [facility] stool scale 3-5 over the prior week to demonstrate decreased constipation.    Baseline  Pt. is having multiple type 1-2 BM's daily    Time  10    Period  Weeks    Status  Achieved    Target Date  04/22/19            Plan - 04/21/19 1304    Clinical Impression Statement  Pt. has met all goals today, though she cotinues to have postural weakness and a tendency to slip back into poor pelvic and spinal alignment. She only has increased pain for < 10 min. with specific motions which were reviewed today to improve body-mechanics and proved to illiminate pain spike. She demonstrates understanding of which exercises she needs to continue to  perform and for what duration and acknowledges that she would benefit from a regular strengthening program that focuses on deep-core engagement such as pilates. We will D/C at this time to HEP as Pt. feels she has made great progress and that she will be able to continue improving on her own at this time.    PT Next Visit Plan  D/C    PT Home Exercise Plan  breathing, soda-can and sit-to-stand, standing hip-flexor stretch, L heel-lift, tennis ball release, seated pelvic tilts, side-stretch, clam-shells with eccentric focus and Red band, seated posture, chin-tucks and scapular retractions, R side-down counter-top plank, posture taping, horizontal ABD, chest stretch, tall kneeling hip-hinge, standing hip ABD, toileting, splinting, and squatty potty, arch crunches, bow-and-arrow to R only with stretch, kegels and pelvic tilt with pillow.Marland Kitchen    Consulted and Agree with Plan of Care  Patient       Patient will benefit from skilled therapeutic intervention in order to improve the following deficits and impairments:     Visit Diagnosis: Other muscle spasm  Abnormal posture  Sacrococcygeal disorders, not elsewhere classified     Problem List Patient Active Problem List   Diagnosis Date Noted  . Labor and delivery indication for care or intervention 12/14/2018  . Mastalgia 07/06/2016   Willa Rough DPT, ATC Willa Rough 04/21/2019, 3:33 PM  [place] MAIN [facility] SERVICES [address] [place], [place], [address] Phone: [phone]   Fax:  [phone]  Name: Savannah Mills MRN: [id number] Date of Birth: 20-Sep-1995

## 2020-10-18 ENCOUNTER — Other Ambulatory Visit: Payer: Self-pay | Admitting: Obstetrics and Gynecology

## 2020-10-18 DIAGNOSIS — N6002 Solitary cyst of left breast: Secondary | ICD-10-CM

## 2020-10-20 ENCOUNTER — Other Ambulatory Visit: Payer: Self-pay

## 2020-10-20 ENCOUNTER — Ambulatory Visit
Admission: RE | Admit: 2020-10-20 | Discharge: 2020-10-20 | Disposition: A | Discharge status: Home / Self Care | Payer: Managed Care, Other (non HMO) | Source: Ambulatory Visit | Attending: Obstetrics and Gynecology | Admitting: Obstetrics and Gynecology

## 2020-10-20 DIAGNOSIS — N6002 Solitary cyst of left breast: Secondary | ICD-10-CM | POA: Insufficient documentation

## 2021-04-01 ENCOUNTER — Encounter (HOSPITAL_BASED_OUTPATIENT_CLINIC_OR_DEPARTMENT_OTHER): Payer: Self-pay | Admitting: *Deleted

## 2021-04-01 ENCOUNTER — Other Ambulatory Visit: Payer: Self-pay

## 2021-04-01 ENCOUNTER — Emergency Department (HOSPITAL_BASED_OUTPATIENT_CLINIC_OR_DEPARTMENT_OTHER)
Admission: EM | Admit: 2021-04-01 | Discharge: 2021-04-01 | Disposition: A | Discharge status: Home / Self Care | Payer: 59 | Attending: Emergency Medicine | Admitting: Emergency Medicine

## 2021-04-01 ENCOUNTER — Emergency Department (HOSPITAL_BASED_OUTPATIENT_CLINIC_OR_DEPARTMENT_OTHER): Payer: 59

## 2021-04-01 DIAGNOSIS — N132 Hydronephrosis with renal and ureteral calculous obstruction: Secondary | ICD-10-CM | POA: Insufficient documentation

## 2021-04-01 DIAGNOSIS — R109 Unspecified abdominal pain: Secondary | ICD-10-CM | POA: Diagnosis present

## 2021-04-01 LAB — URINALYSIS, ROUTINE W REFLEX MICROSCOPIC
Bilirubin Urine: NEGATIVE
Glucose, UA: NEGATIVE mg/dL
Ketones, ur: NEGATIVE mg/dL
Leukocytes,Ua: NEGATIVE
Nitrite: NEGATIVE
Protein, ur: NEGATIVE mg/dL
Specific Gravity, Urine: 1.008 (ref 1.005–1.030)
pH: 5.5 (ref 5.0–8.0)

## 2021-04-01 LAB — BASIC METABOLIC PANEL
Anion gap: 7 (ref 5–15)
BUN: 9 mg/dL (ref 6–20)
CO2: 26 mmol/L (ref 22–32)
Calcium: 9.3 mg/dL (ref 8.9–10.3)
Chloride: 106 mmol/L (ref 98–111)
Creatinine, Ser: 0.77 mg/dL (ref 0.44–1.00)
GFR, Estimated: >60 mL/min (ref >60)
Glucose, Bld: 81 mg/dL (ref 70–99)
Potassium: 4.4 mmol/L (ref 3.5–5.1)
Sodium: 139 mmol/L (ref 135–145)

## 2021-04-01 LAB — CBC
HCT: 39.6 % (ref 36.0–46.0)
Hemoglobin: 13.3 g/dL (ref 12.0–15.0)
MCH: 28.5 pg (ref 26.0–34.0)
MCHC: 33.6 g/dL (ref 30.0–36.0)
MCV: 84.8 fL (ref 80.0–100.0)
Platelets: 222 10*3/uL (ref 150–400)
RBC: 4.67 MIL/uL (ref 3.87–5.11)
RDW: 11.7 % (ref 11.5–15.5)
WBC: 5 10*3/uL (ref 4.0–10.5)
nRBC: 0 % (ref 0.0–0.2)

## 2021-04-01 LAB — PREGNANCY, URINE: Preg Test, Ur: NEGATIVE

## 2021-04-01 MED ORDER — KETOROLAC TROMETHAMINE 30 MG/ML IJ SOLN
30.0000 mg | Freq: Once | INTRAMUSCULAR | Status: AC
  Administered 2021-04-01: 30 mg via INTRAVENOUS
  Filled 2021-04-01: qty 1

## 2021-04-01 MED ORDER — ONDANSETRON HCL 4 MG/2ML IJ SOLN
4.0000 mg | Freq: Once | INTRAMUSCULAR | Status: AC
Start: 2021-04-01 — End: 2021-04-01
  Administered 2021-04-01: 4 mg via INTRAVENOUS
  Filled 2021-04-01: qty 2

## 2021-04-01 MED ORDER — ONDANSETRON 8 MG PO TBDP: 8.0000 mg | ORAL_TABLET | Freq: Three times a day (TID) | ORAL | 0 refills | Status: DC | PRN

## 2021-04-01 MED ORDER — NAPROXEN 375 MG PO TABS: 375.0000 mg | ORAL_TABLET | Freq: Two times a day (BID) | ORAL | 0 refills | Status: DC

## 2021-04-01 MED ORDER — HYDROCODONE-ACETAMINOPHEN 5-325 MG PO TABS: 1.0000 | ORAL_TABLET | Freq: Four times a day (QID) | ORAL | 0 refills | Status: DC | PRN

## 2021-04-01 NOTE — ED Notes (Signed)
Patient returned from CT

## 2021-04-01 NOTE — ED Notes (Signed)
RN provided AVS using Teachback Method. Patient verbalizes understanding of Discharge Instructions. Opportunity for Questioning and Answers were provided by RN. Patient Discharged from ED ambulatory to Home with Significant Other.  

## 2021-04-01 NOTE — ED Notes (Signed)
MD at Bedside.

## 2021-04-01 NOTE — Discharge Instructions (Addendum)
Use a urine strainer to help determine if you passed the kidney stone.  Take the medications as needed for pain and nausea.  Follow-up with a urologist for further evaluation if the symptoms persist or you do not pass the stone

## 2021-04-01 NOTE — ED Provider Notes (Signed)
[facility] EMERGENCY DEPT Provider Note   CSN: [id number] Arrival date & time: 04/01/21  1230     History  Chief Complaint  Patient presents with   Flank Pain    Savannah Mills is a 26 y.o. female.   Flank Pain  Patient has history of anemia anxiety and irritable bowel syndrome.  She has had prior colonoscopy.  No history of kidney stones Patient presented to the emergency room for evaluation of acute flank pain.  Patient states she started having some urinary symptoms earlier this week.  She was evaluated at another medical facility and was diagnosed with a UTI.  Patient states she was started on an antibiotic.  Side medical medical records reviewed from the visit at [facility].  Patient's urinalysis showed large amount of blood moderate bacteria 0-3 white blood cells.  Patient states today she started having severe pain in her right flank.  She has been nauseated but has not vomited.  Her symptoms are getting worse despite the antibiotics.  Home Medications Prior to Admission medications   Medication Sig Start Date End Date Taking? Authorizing Provider  HYDROcodone-acetaminophen (NORCO/VICODIN) 5-325 MG tablet Take 1 tablet by mouth every 6 (six) hours as needed. 04/01/21  Yes Linwood Dibbles, MD  naproxen (NAPROSYN) 375 MG tablet Take 1 tablet (375 mg total) by mouth 2 (two) times daily. 04/01/21  Yes Linwood Dibbles, MD  ondansetron (ZOFRAN-ODT) 8 MG disintegrating tablet Take 1 tablet (8 mg total) by mouth every 8 (eight) hours as needed for nausea or vomiting. 04/01/21  Yes Linwood Dibbles, MD  acetaminophen (TYLENOL) 500 MG tablet Take 2 tablets (1,000 mg total) by mouth every 6 (six) hours as needed for mild pain or moderate pain. 12/17/18   Genia Del, CNM  benzocaine-Menthol (DERMOPLAST) 20-0.5 % AERO Apply 1 application topically as needed for irritation (perineal discomfort). 12/17/18   Genia Del, CNM  cetirizine (ZYRTEC) 5 MG tablet Take 5 mg by mouth daily.     [provider]  ferrous sulfate 325 (65 FE) MG tablet Take 1 tablet (325 mg total) by mouth 2 (two) times daily with a meal. 12/17/18   Genia Del, CNM  ibuprofen (ADVIL) 600 MG tablet Take 1 tablet (600 mg total) by mouth every 6 (six) hours. 12/17/18   Genia Del, CNM  oxyCODONE (OXY IR/ROXICODONE) 5 MG immediate release tablet Take 1 tablet (5 mg total) by mouth every 4 (four) hours as needed (pain scale 4-7). Patient not taking: Reported on 02/11/2019 12/17/18   Genia Del, CNM  Prenatal Vit-Fe Fumarate-FA (PRENATAL MULTIVITAMIN) TABS tablet Take 1 tablet by mouth daily at 12 noon.    [provider]  senna-docusate (SENOKOT-S) 8.6-50 MG tablet Take 2 tablets by mouth at bedtime as needed for mild constipation or moderate constipation. Patient not taking: Reported on 02/11/2019 12/17/18   Genia Del, CNM      Allergies    Cinnamon    Review of Systems   Review of Systems  Constitutional:  Negative for fever.  Genitourinary:  Positive for flank pain.   Physical Exam Updated Vital Signs BP 118/76 (BP Location: Right Arm)    Pulse 60    Temp 98.2 F (36.8 C)    Resp 12    Ht 1.676 m (5\' 6" )    Wt 52.2 kg    LMP 03/19/2021    SpO2 100%    BMI 18.56 kg/m  Physical Exam Vitals and nursing note reviewed.  Constitutional:  General: She is not in acute distress.    Appearance: She is well-developed.  HENT:     Head: Normocephalic and atraumatic.     Right Ear: External ear normal.     Left Ear: External ear normal.  Eyes:     General: No scleral icterus.       Right eye: No discharge.        Left eye: No discharge.     Conjunctiva/sclera: Conjunctivae normal.  Neck:     Trachea: No tracheal deviation.  Cardiovascular:     Rate and Rhythm: Normal rate and regular rhythm.  Pulmonary:     Effort: Pulmonary effort is normal. No respiratory distress.     Breath sounds: Normal breath sounds. No stridor. No wheezing or rales.   Abdominal:     General: Bowel sounds are normal. There is no distension.     Palpations: Abdomen is soft.     Tenderness: There is no abdominal tenderness. There is no guarding or rebound.  Musculoskeletal:        General: No tenderness or deformity.     Cervical back: Neck supple.  Skin:    General: Skin is warm and dry.     Findings: No rash.  Neurological:     General: No focal deficit present.     Mental Status: She is alert.     Cranial Nerves: No cranial nerve deficit (no facial droop, extraocular movements intact, no slurred speech).     Sensory: No sensory deficit.     Motor: No abnormal muscle tone or seizure activity.     Coordination: Coordination normal.  Psychiatric:        Mood and Affect: Mood normal.    ED Results / Procedures / Treatments   Labs (all labs ordered are listed, but only abnormal results are displayed) Labs Reviewed  URINALYSIS, ROUTINE W REFLEX MICROSCOPIC - Abnormal; Notable for the following components:      Result Value   Color, Urine COLORLESS (*)    Hgb urine dipstick MODERATE (*)    All other components within normal limits  PREGNANCY, URINE  CBC  BASIC METABOLIC PANEL    EKG None  Radiology CT Renal Stone Study  Result Date: 04/01/2021 CLINICAL DATA:  Lambert Mody right-sided flank pain EXAM: CT ABDOMEN AND PELVIS WITHOUT CONTRAST TECHNIQUE: Multidetector CT imaging of the abdomen and pelvis was performed following the standard protocol without IV contrast. COMPARISON:  None. FINDINGS: Lower chest: No acute abnormality. Pectus excavatum deformity of the lower anterior chest wall. Hepatobiliary: No focal liver abnormality is seen. No gallstones, gallbladder wall thickening, or biliary dilatation. Pancreas: Unremarkable. No pancreatic ductal dilatation or surrounding inflammatory changes. Spleen: Normal in size without focal abnormality. Adrenals/Urinary Tract: Adrenal glands are normal. Moderate right hydronephrosis and hydroureter, secondary to  a 3 mm stone within the distal right ureter slightly proximal to the right UVJ. Stomach/Bowel: Stomach is within normal limits. Appendix appears normal. No evidence of bowel wall thickening, distention, or inflammatory changes. Vascular/Lymphatic: No significant vascular findings are present. No enlarged abdominal or pelvic lymph nodes. Reproductive: Uterus and bilateral adnexa are unremarkable. Other: No abdominal wall hernia or abnormality. No abdominopelvic ascites. Musculoskeletal: No acute or significant osseous findings. IMPRESSION: 1. Moderate right hydronephrosis and hydroureter, secondary to a 3 mm stone in the distal right ureter slightly proximal to the right UVJ 2. Pectus deformity of the lower anterior chest wall Electronically Signed   By: Jasmine Pang M.D.   On: 04/01/2021 17:26  Procedures Procedures    Medications Ordered in ED Medications  ketorolac (TORADOL) 30 MG/ML injection 30 mg (30 mg Intravenous Given 04/01/21 1644)  ondansetron (ZOFRAN) injection 4 mg (4 mg Intravenous Given 04/01/21 1645)    ED Course/ Medical Decision Making/ A&P Clinical Course as of 04/01/21 1813  Fri Apr 01, 2021  1637 CBC is normal.  Metabolic panel is normal.  Pregnancy test is negative.  Urine does not suggest infection but there is moderate hemoglobin.  Concerning for the possibility of renal colic.  We will proceed with CT scan [JK]    Clinical Course User Index [JK] Linwood DibblesKnapp, Lowanda Cashaw, MD                           Medical Decision Making  Patient presented to the ED for evaluation of flank pain.  Recently seen and diagnosed with a urinary tract infection.  Patient's presentation concerning for possibility of ureteral stone.  CT scan was performed and it does show evidence of a 3 mm stone.  Laboratory tests are otherwise unremarkable.  Patient was treated with IV Toradol with significant improvement in her symptoms.  She is afebrile and nontoxic.  Does not require admission for her kidney stone.   Will discharge home with medications for pain outpatient follow-up with urology.        Final Clinical Impression(s) / ED Diagnoses Final diagnoses:  Ureteral stone with hydronephrosis    Rx / DC Orders ED Discharge Orders          Ordered    naproxen (NAPROSYN) 375 MG tablet  2 times daily        04/01/21 1811    HYDROcodone-acetaminophen (NORCO/VICODIN) 5-325 MG tablet  Every 6 hours PRN        04/01/21 1811    ondansetron (ZOFRAN-ODT) 8 MG disintegrating tablet  Every 8 hours PRN        04/01/21 1811              Linwood DibblesKnapp, Jiana Lemaire, MD 04/01/21 1813

## 2021-04-01 NOTE — ED Notes (Signed)
Patient transported to CT 

## 2021-04-01 NOTE — ED Triage Notes (Signed)
Rt flank pain, tested positive for UTI yesterday and started on meds, today Heiss rt flank pain, UA did have some blood in urine

## 2022-04-29 IMAGING — CT CT RENAL STONE PROTOCOL
3 of 4 series · 8 of 46 positions shown, 15 images · non-contrast
Comparison: None.

CLINICAL DATA: Sharp right-sided flank pain

EXAM:
CT ABDOMEN AND PELVIS WITHOUT CONTRAST
TECHNIQUE: Multidetector CT imaging of the abdomen and pelvis was performed
following the standard protocol without IV contrast.

[Series 4: lungs · axial · 0.61mm/px · z∈[-282,-222]mm · 4 of 20 slices shown, 9 images]
[im 4/20  soft-tissue]
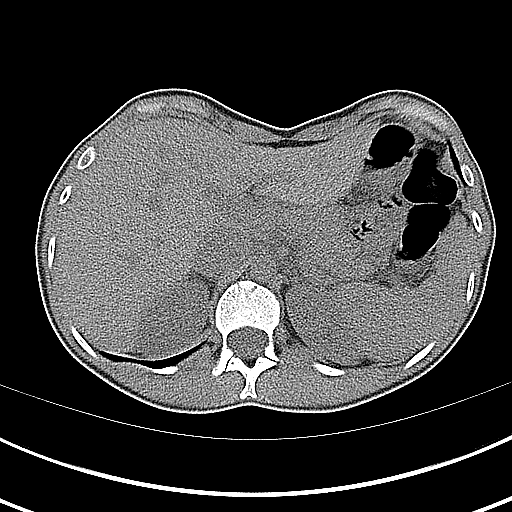
[im 4/20  lung]
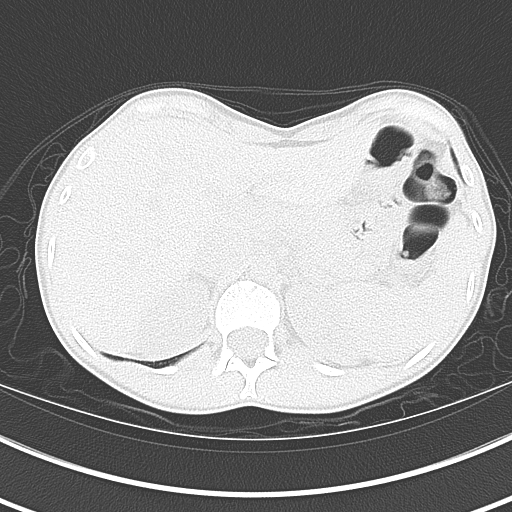
[im 4/20  bone]
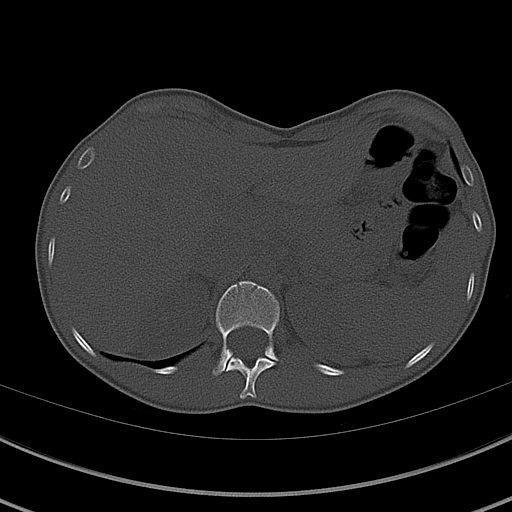
[im 8/20  soft-tissue]
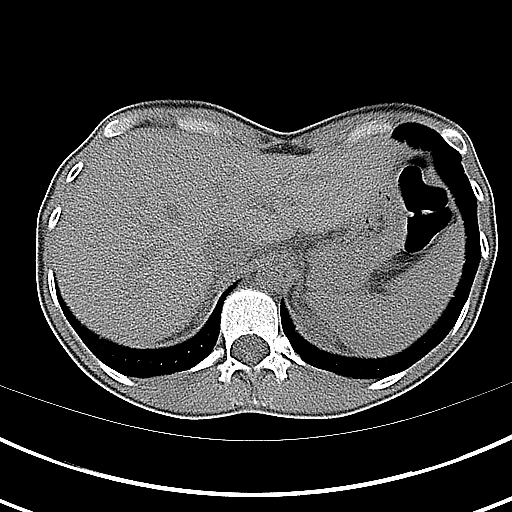
[im 8/20  lung]
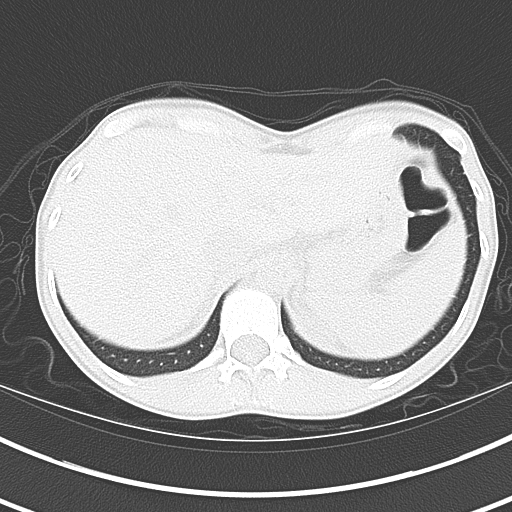
[im 12/20  soft-tissue]
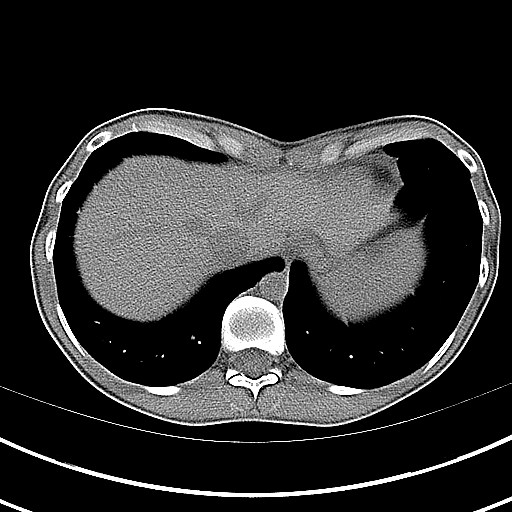
[im 12/20  lung]
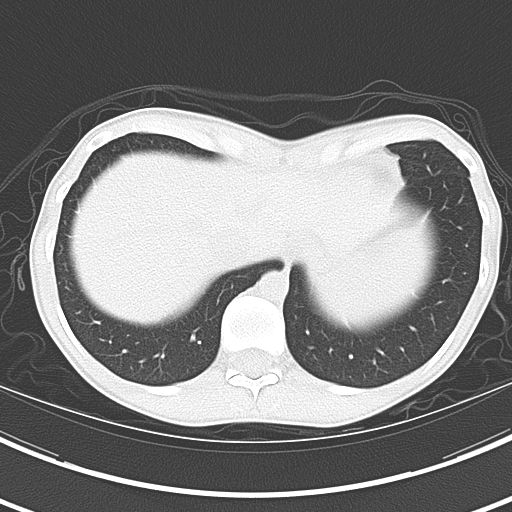
[im 16/20  soft-tissue]
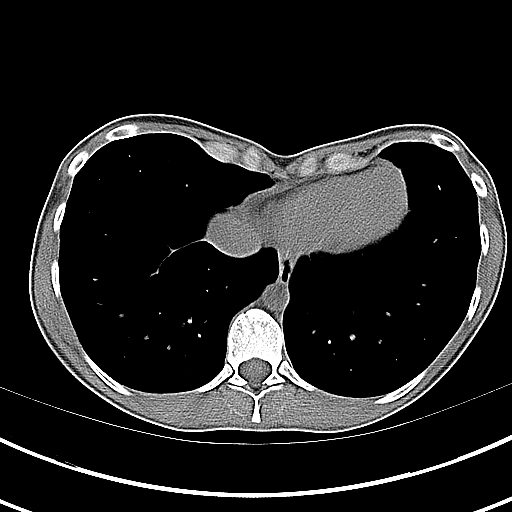
[im 16/20  lung]
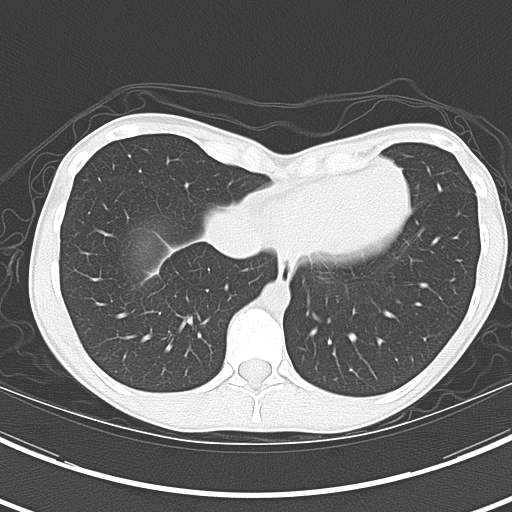

[Series 5: coronal · coronal · 0.70mm/px · 3 of 72 slices shown, 4 images]
[im 24/72  soft-tissue]
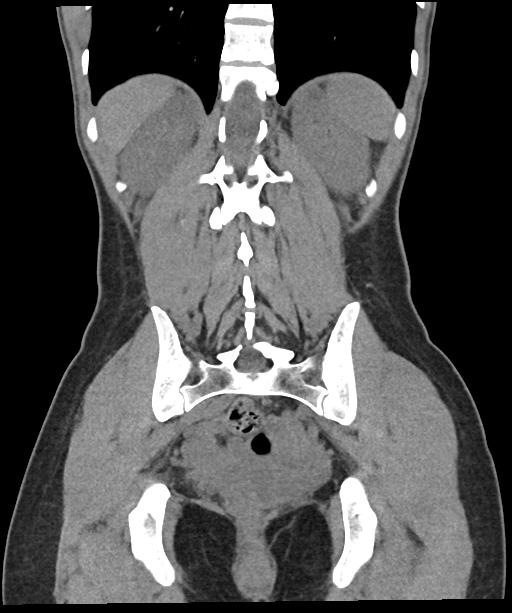
[im 32/72  soft-tissue]
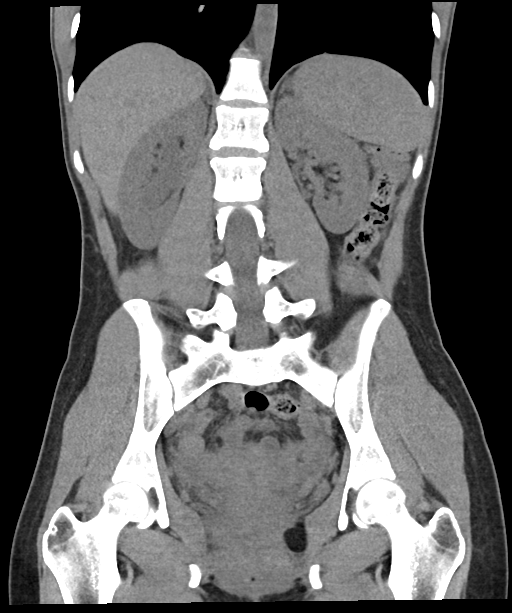
[im 32/72  bone]
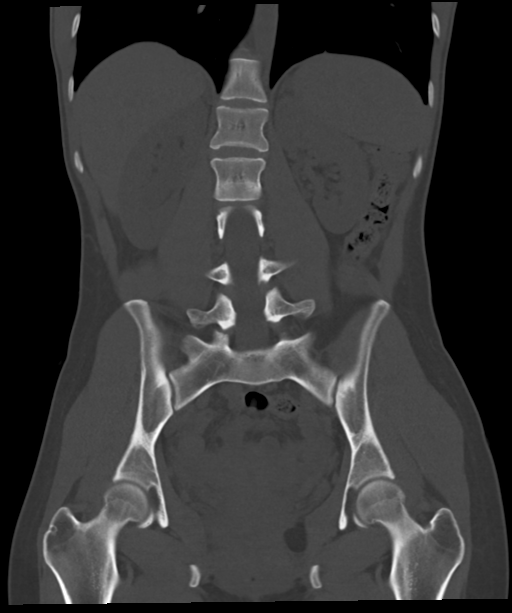
[im 40/72  soft-tissue]
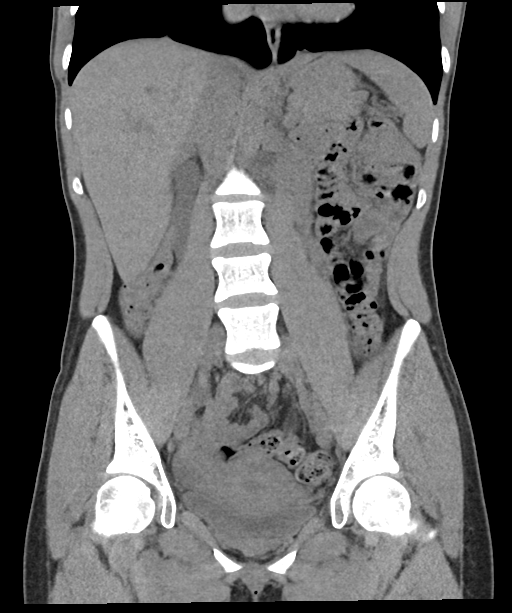

[Series 6: sagittal · sagittal · 0.44mm/px · 1 of 110 slices shown, 2 images]
[im 37/110  soft-tissue]
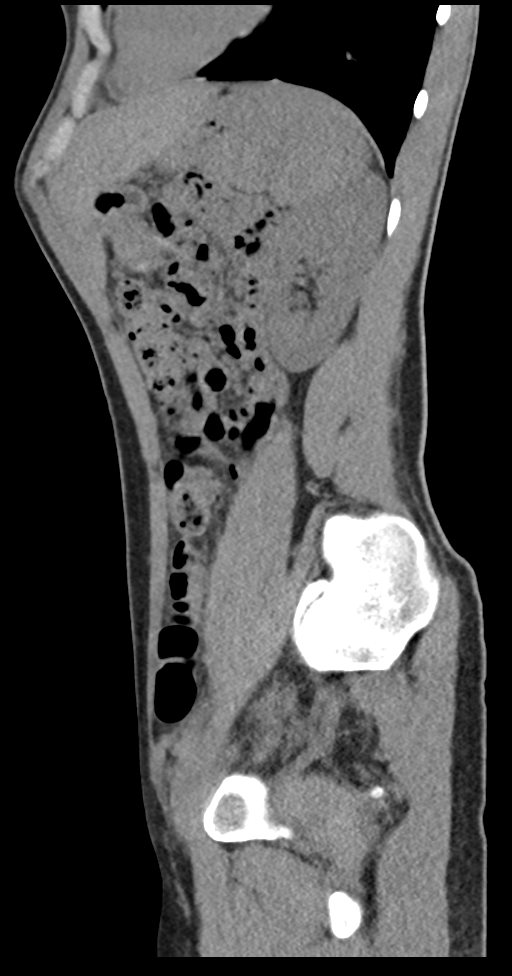
[im 37/110  bone]
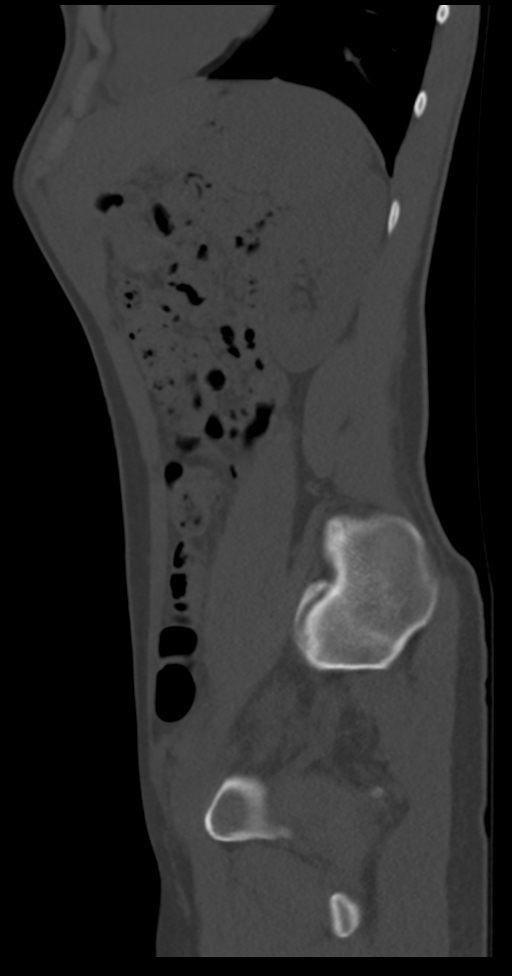

[8 of 46 positions shown; findings below may reference images not displayed]

FINDINGS: Lower chest: No acute abnormality. Pectus excavatum deformity of the
lower anterior chest wall.

Hepatobiliary: No focal liver abnormality is seen. No gallstones,
gallbladder wall thickening, or biliary dilatation.

Pancreas: Unremarkable. No pancreatic ductal dilatation or
surrounding inflammatory changes.

Spleen: Normal in size without focal abnormality.

Adrenals/Urinary Tract: Adrenal glands are normal. Moderate right
hydronephrosis and hydroureter, secondary to a 3 mm stone within the
distal right ureter slightly proximal to the right UVJ.

Stomach/Bowel: Stomach is within normal limits. Appendix appears
normal. No evidence of bowel wall thickening, distention, or
inflammatory changes.

Vascular/Lymphatic: No significant vascular findings are present. No
enlarged abdominal or pelvic lymph nodes.

Reproductive: Uterus and bilateral adnexa are unremarkable.

Other: No abdominal wall hernia or abnormality. No abdominopelvic
ascites.

Musculoskeletal: No acute or significant osseous findings.
IMPRESSION: 1. Moderate right hydronephrosis and hydroureter, secondary to a 3
mm stone in the distal right ureter slightly proximal to the right
UVJ
2. Pectus deformity of the lower anterior chest wall

## 2022-05-17 LAB — OB RESULTS CONSOLE ABO/RH: RH Type: POSITIVE

## 2022-05-17 LAB — OB RESULTS CONSOLE HIV ANTIBODY (ROUTINE TESTING): HIV: NONREACTIVE

## 2022-05-17 LAB — OB RESULTS CONSOLE RUBELLA ANTIBODY, IGM: Rubella: IMMUNE

## 2022-05-17 LAB — OB RESULTS CONSOLE RPR: RPR: NONREACTIVE

## 2022-05-17 LAB — OB RESULTS CONSOLE GC/CHLAMYDIA
Chlamydia: NEGATIVE
Neisseria Gonorrhea: NEGATIVE

## 2022-05-17 LAB — OB RESULTS CONSOLE HEPATITIS B SURFACE ANTIGEN: Hepatitis B Surface Ag: NEGATIVE

## 2022-10-24 ENCOUNTER — Encounter: Payer: Self-pay | Admitting: Obstetrics and Gynecology

## 2022-10-24 ENCOUNTER — Other Ambulatory Visit: Payer: Self-pay

## 2022-10-24 ENCOUNTER — Observation Stay
Admission: EM | Admit: 2022-10-24 | Discharge: 2022-10-24 | Disposition: A | Discharge status: Home / Self Care | Payer: 59

## 2022-10-24 DIAGNOSIS — Z79899 Other long term (current) drug therapy: Secondary | ICD-10-CM | POA: Insufficient documentation

## 2022-10-24 DIAGNOSIS — Z3A31 31 weeks gestation of pregnancy: Secondary | ICD-10-CM | POA: Insufficient documentation

## 2022-10-24 DIAGNOSIS — O4703 False labor before 37 completed weeks of gestation, third trimester: Secondary | ICD-10-CM | POA: Diagnosis not present

## 2022-10-24 DIAGNOSIS — O163 Unspecified maternal hypertension, third trimester: Secondary | ICD-10-CM | POA: Diagnosis not present

## 2022-10-24 LAB — CBC WITH DIFFERENTIAL/PLATELET
Abs Immature Granulocytes: 0.18 10*3/uL — ABNORMAL HIGH (ref 0.00–0.07)
Basophils Absolute: 0.1 10*3/uL (ref 0.0–0.1)
Basophils Relative: 1 %
Eosinophils Absolute: 0.2 10*3/uL (ref 0.0–0.5)
Eosinophils Relative: 2 %
HCT: 32 % — ABNORMAL LOW (ref 36.0–46.0)
Hemoglobin: 10.9 g/dL — ABNORMAL LOW (ref 12.0–15.0)
Immature Granulocytes: 2 %
Lymphocytes Relative: 16 %
Lymphs Abs: 1.6 10*3/uL (ref 0.7–4.0)
MCH: 28.8 pg (ref 26.0–34.0)
MCHC: 34.1 g/dL (ref 30.0–36.0)
MCV: 84.4 fL (ref 80.0–100.0)
Monocytes Absolute: 0.9 10*3/uL (ref 0.1–1.0)
Monocytes Relative: 9 %
Neutro Abs: 6.8 10*3/uL (ref 1.7–7.7)
Neutrophils Relative %: 70 %
Platelets: 169 10*3/uL (ref 150–400)
RBC: 3.79 MIL/uL — ABNORMAL LOW (ref 3.87–5.11)
RDW: 12.5 % (ref 11.5–15.5)
WBC: 9.7 10*3/uL (ref 4.0–10.5)
nRBC: 0 % (ref 0.0–0.2)

## 2022-10-24 LAB — COMPREHENSIVE METABOLIC PANEL
ALT: 10 U/L (ref 0–44)
AST: 14 U/L — ABNORMAL LOW (ref 15–41)
Albumin: 2.9 g/dL — ABNORMAL LOW (ref 3.5–5.0)
Alkaline Phosphatase: 79 U/L (ref 38–126)
Anion gap: 9 (ref 5–15)
BUN: 12 mg/dL (ref 6–20)
CO2: 20 mmol/L — ABNORMAL LOW (ref 22–32)
Calcium: 8.4 mg/dL — ABNORMAL LOW (ref 8.9–10.3)
Chloride: 106 mmol/L (ref 98–111)
Creatinine, Ser: 0.53 mg/dL (ref 0.44–1.00)
GFR, Estimated: >60 mL/min (ref >60)
Glucose, Bld: 83 mg/dL (ref 70–99)
Potassium: 3.8 mmol/L (ref 3.5–5.1)
Sodium: 135 mmol/L (ref 135–145)
Total Bilirubin: 0.5 mg/dL (ref 0.3–1.2)
Total Protein: 6.2 g/dL — ABNORMAL LOW (ref 6.5–8.1)

## 2022-10-24 LAB — URINALYSIS, COMPLETE (UACMP) WITH MICROSCOPIC
Bacteria, UA: NONE SEEN
Bilirubin Urine: NEGATIVE
Glucose, UA: NEGATIVE mg/dL
Hgb urine dipstick: NEGATIVE
Ketones, ur: NEGATIVE mg/dL
Leukocytes,Ua: NEGATIVE
Nitrite: NEGATIVE
Protein, ur: NEGATIVE mg/dL
Specific Gravity, Urine: 1.012 (ref 1.005–1.030)
pH: 7 (ref 5.0–8.0)

## 2022-10-24 LAB — PROTEIN / CREATININE RATIO, URINE
Creatinine, Urine: 55 mg/dL
Protein Creatinine Ratio: 0.11 mg/mg{Cre} (ref 0.00–0.15)
Total Protein, Urine: 6 mg/dL

## 2022-10-24 LAB — WET PREP, GENITAL
Clue Cells Wet Prep HPF POC: NONE SEEN
Sperm: NONE SEEN
Trich, Wet Prep: NONE SEEN
WBC, Wet Prep HPF POC: <10 (ref <10)
Yeast Wet Prep HPF POC: NONE SEEN

## 2022-10-24 LAB — FETAL FIBRONECTIN: Fetal Fibronectin: NEGATIVE

## 2022-10-24 MED ORDER — CALCIUM CARBONATE ANTACID 500 MG PO CHEW: 2.0000 | CHEWABLE_TABLET | ORAL | Status: DC | PRN

## 2022-10-24 MED ORDER — ACETAMINOPHEN 500 MG PO TABS: 1000.0000 mg | ORAL_TABLET | Freq: Four times a day (QID) | ORAL | Status: DC | PRN

## 2022-10-24 MED ORDER — TERBUTALINE SULFATE 1 MG/ML IJ SOLN
0.2500 mg | Freq: Once | INTRAMUSCULAR | Status: AC | PRN
  Administered 2022-10-24: 0.25 mg via SUBCUTANEOUS
  Filled 2022-10-24: qty 1

## 2022-10-24 MED ORDER — LACTATED RINGERS IV BOLUS
1000.0000 mL | Freq: Once | INTRAVENOUS | Status: AC
  Administered 2022-10-24: 1000 mL via INTRAVENOUS

## 2022-10-24 NOTE — OB Triage Note (Signed)
Discharge instructions provided to pt.  Vaginal bleeding and discharge, contractions, and fetal movement reviewed by RN. Follow-up care reviewed. Pt discharged home with husband.

## 2022-10-24 NOTE — OB Triage Note (Signed)
Pt is a 27yo G2P1, 31w 5d. She arrived to the unit with complaints of vision disturbances and high blood pressure. She stated that she took her blood pressure at 0818 at work and the BP reading was 153/95, at this time she felt sweaty and had very blurry vision with spots. She was then driven to the hospital.  She denies vaginal bleeding, reports positive fetal movement, and reports no contractions at this time.  Initial BP 113/77 and BP cycling q15. VS stable, monitors applied and assessing.   Initial FHT 120 at 0850.

## 2022-10-24 NOTE — Discharge Summary (Signed)
Savannah Mills is a 27 y.o. female. She is at [redacted]w[redacted]d gestation. No LMP recorded. Patient is pregnant. 12/21/2022, by Patient Reported   Prenatal care site:  [facility]   Chief complaint: visual disturbances and elevated blood pressure at home   Admission Diagnoses:  1) intrauterine pregnancy at [redacted]w[redacted]d  2) Elevated blood pressure affecting pregnancy in third trimester, antepartum [O16.3] 3) Preterm uterine contractions   Discharge Diagnoses:  Principal Problem:   Elevated blood pressure affecting pregnancy in third trimester, antepartum Active Problems:   Preterm uterine contractions in third trimester, antepartum  HPI: Savannah Mills presents to L&D with complaints of visual disturbances and elevated blood pressure at work.  She reports feeling sweaty and had blurred vision this morning.  She took her blood pressure at work and it was 153/95.  Her pregnancy is uncomplicated.  She denies Contractions, Loss of fluid, or Vaginal bleeding. Endorses fetal movement as active. Denies current visual changes, HA, or RUQ pain.  Denies any blood pressures elevations in pregnancy and states the one today was the first time it was elevated.    S: Resting comfortably. no CTX, no VB.no LOF,  Active fetal movement.   Maternal Medical History:  Past Medical Hx:  has a past medical history of Anemia, Anxiety, Bilateral breast cysts, and IBS (irritable bowel syndrome).    Past Surgical Hx:  has a past surgical history that includes Colonoscopy (6 years); Upper gi endoscopy (6 years ago); ingrown toenail excision; Wisdom tooth extraction; and Minor hemorrhoidectomy.   Allergies  Allergen Reactions   Cinnamon Anaphylaxis     Prior to Admission medications   Medication Sig Start Date End Date Taking? Authorizing Provider  acetaminophen (TYLENOL) 500 MG tablet Take 2 tablets (1,000 mg total) by mouth every 6 (six) hours as needed for mild pain or moderate pain. 12/17/18   Genia Del, CNM  benzocaine-Menthol (DERMOPLAST) 20-0.5 % AERO Apply 1 application topically as needed for irritation (perineal discomfort). 12/17/18   Genia Del, CNM  cetirizine (ZYRTEC) 5 MG tablet Take 5 mg by mouth daily.    [provider]  ferrous sulfate 325 (65 FE) MG tablet Take 1 tablet (325 mg total) by mouth 2 (two) times daily with a meal. 12/17/18   Genia Del, CNM  HYDROcodone-acetaminophen (NORCO/VICODIN) 5-325 MG tablet Take 1 tablet by mouth every 6 (six) hours as needed. 04/01/21   Linwood Dibbles, MD  ibuprofen (ADVIL) 600 MG tablet Take 1 tablet (600 mg total) by mouth every 6 (six) hours. 12/17/18   Genia Del, CNM  naproxen (NAPROSYN) 375 MG tablet Take 1 tablet (375 mg total) by mouth 2 (two) times daily. 04/01/21   Linwood Dibbles, MD  ondansetron (ZOFRAN-ODT) 8 MG disintegrating tablet Take 1 tablet (8 mg total) by mouth every 8 (eight) hours as needed for nausea or vomiting. 04/01/21   Linwood Dibbles, MD  oxyCODONE (OXY IR/ROXICODONE) 5 MG immediate release tablet Take 1 tablet (5 mg total) by mouth every 4 (four) hours as needed (pain scale 4-7). Patient not taking: Reported on 02/11/2019 12/17/18   Genia Del, CNM  Prenatal Vit-Fe Fumarate-FA (PRENATAL MULTIVITAMIN) TABS tablet Take 1 tablet by mouth daily at 12 noon.    [provider]  senna-docusate (SENOKOT-S) 8.6-50 MG tablet Take 2 tablets by mouth at bedtime as needed for mild constipation or moderate constipation. Patient not taking: Reported on 02/11/2019 12/17/18   Genia Del, CNM    Social History: She  reports that she has  never smoked. She has never used smokeless tobacco. She reports that she does not currently use alcohol. She reports that she does not use drugs.  Family History: family history includes Alcoholism in her mother; Anxiety disorder in her brother; Breast cancer in her maternal aunt; Cancer in her maternal grandmother and mother; Depression in her mother and  sister; Diabetes in her maternal grandfather; Skin cancer in her maternal grandmother.   Review of Systems: A full review of systems was performed and negative except as noted in the HPI.     Pertinent Results:   O:  BP 103/74   Pulse 85   Temp 98.1 F (36.7 C) (Oral)   Resp 16   Ht 5\' 6"  (1.676 m)   Wt 74.8 kg   BMI 26.63 kg/m  Results for orders placed or performed during the hospital encounter of 10/24/22 (from the past 48 hour(s))  Protein / creatinine ratio, urine   Collection Time: 10/24/22  9:02 AM  Result Value Ref Range   Creatinine, Urine 55 mg/dL   Total Protein, Urine 6 mg/dL   Protein Creatinine Ratio 0.11 0.00 - 0.15 mg/mg[Cre]  Urinalysis, Complete w Microscopic -Urine, Clean Catch   Collection Time: 10/24/22  9:02 AM  Result Value Ref Range   Color, Urine STRAW (A) YELLOW   APPearance CLEAR (A) CLEAR   Specific Gravity, Urine 1.012 1.005 - 1.030   pH 7.0 5.0 - 8.0   Glucose, UA NEGATIVE NEGATIVE mg/dL   Hgb urine dipstick NEGATIVE NEGATIVE   Bilirubin Urine NEGATIVE NEGATIVE   Ketones, ur NEGATIVE NEGATIVE mg/dL   Protein, ur NEGATIVE NEGATIVE mg/dL   Nitrite NEGATIVE NEGATIVE   Leukocytes,Ua NEGATIVE NEGATIVE   RBC / HPF 0-5 0 - 5 RBC/hpf   WBC, UA 0-5 0 - 5 WBC/hpf   Bacteria, UA NONE SEEN NONE SEEN   Squamous Epithelial / HPF 0-5 0 - 5 /HPF  CBC with Differential/Platelet   Collection Time: 10/24/22  9:20 AM  Result Value Ref Range   WBC 9.7 4.0 - 10.5 K/uL   RBC 3.79 (L) 3.87 - 5.11 MIL/uL   Hemoglobin 10.9 (L) 12.0 - 15.0 g/dL   HCT 40.9 (L) 81.1 - 91.4 %   MCV 84.4 80.0 - 100.0 fL   MCH 28.8 26.0 - 34.0 pg   MCHC 34.1 30.0 - 36.0 g/dL   RDW 78.2 95.6 - 21.3 %   Platelets 169 150 - 400 K/uL   nRBC 0.0 0.0 - 0.2 %   Neutrophils Relative % 70 %   Neutro Abs 6.8 1.7 - 7.7 K/uL   Lymphocytes Relative 16 %   Lymphs Abs 1.6 0.7 - 4.0 K/uL   Monocytes Relative 9 %   Monocytes Absolute 0.9 0.1 - 1.0 K/uL   Eosinophils Relative 2 %    Eosinophils Absolute 0.2 0.0 - 0.5 K/uL   Basophils Relative 1 %   Basophils Absolute 0.1 0.0 - 0.1 K/uL   Immature Granulocytes 2 %   Abs Immature Granulocytes 0.18 (H) 0.00 - 0.07 K/uL  Comprehensive metabolic panel   Collection Time: 10/24/22  9:20 AM  Result Value Ref Range   Sodium 135 135 - 145 mmol/L   Potassium 3.8 3.5 - 5.1 mmol/L   Chloride 106 98 - 111 mmol/L   CO2 20 (L) 22 - 32 mmol/L   Glucose, Bld 83 70 - 99 mg/dL   BUN 12 6 - 20 mg/dL   Creatinine, Ser 0.86 0.44 - 1.00 mg/dL  Calcium 8.4 (L) 8.9 - 10.3 mg/dL   Total Protein 6.2 (L) 6.5 - 8.1 g/dL   Albumin 2.9 (L) 3.5 - 5.0 g/dL   AST 14 (L) 15 - 41 U/L   ALT 10 0 - 44 U/L   Alkaline Phosphatase 79 38 - 126 U/L   Total Bilirubin 0.5 0.3 - 1.2 mg/dL   GFR, Estimated >78 >29 mL/min   Anion gap 9 5 - 15  Wet prep, genital   Collection Time: 10/24/22 11:07 AM   Specimen: Vaginal  Result Value Ref Range   Yeast Wet Prep HPF POC NONE SEEN NONE SEEN   Trich, Wet Prep NONE SEEN NONE SEEN   Clue Cells Wet Prep HPF POC NONE SEEN NONE SEEN   WBC, Wet Prep HPF POC <10 <10   Sperm NONE SEEN   Fetal fibronectin   Collection Time: 10/24/22 11:07 AM  Result Value Ref Range   Fetal Fibronectin NEGATIVE NEGATIVE     Constitutional: NAD, AAOx3  PULM: nl respiratory effort Abd: gravid, non-tender, non-distended, soft  Ext: Non-tender, Nonedmeatous Psych: mood appropriate, speech normal SVE: Dilation: Fingertip Effacement (%): Thick Cervical Position: Posterior Station: -2 Presentation: Vertex Exam by:: A Abbigael Detlefsen CNM  -Unchanged after > 4 hours    NST: Baseline FHR: 130 beats/min Variability: moderate Accelerations: present Decelerations: absent Tocometry: irregular, mild contractions after IV fluids and terbutaline  Time: at least 20 minutes   Interpretation: Category I INDICATIONS: rule out uterine contractions RESULTS:  A NST procedure was performed with FHR monitoring and a normal baseline established,  appropriate time of 20-40 minutes of evaluation, and accels >2 seen w 15x15 characteristics.  Results show a REACTIVE NST.   Consults: None  Hospital Course: The patient was admitted to Labor and Delivery Triage for observation. Blood pressures were WNL and PreE labs were all normal.  She was noted to have frequent uterine contractions and reported new onset back pain and lower abdominal pelvic cramping. IVF bolus was given.  Contractions became irregular less frequent after one dose of terbutaline.  She reported an improvement in pain and denies feeling contractions, back pain, or cramping.  FFN, wet prep, and UA were also negative.  Cervical exam remained unchanged after > 4 hours.  She was deemed stable for discharge and outpatient follow up.    Plan: 1) Reactive NST  -Category 1 tracing  -Reassuring fetal status   2) Preterm labor: Not present  -Discussed warning signs to return to L&D triage with  -Reviewed comfort measures  -FFN neg -Wet prep and UA negative  -Cervical exam unchanged   3) Elevated blood pressure at home -Blood pressure normal throughout triage stay  -Preeclampsia labs normal -Instructed to discuss symptoms with primary OB provider this week.  May be related to pre-syncopal episodes.  -Encouraged to eat protein source with carbohydrates to help keep blood sugar stabilized.   Discharge Condition: stable  Disposition: Discharge disposition: 01-Home or Self Care       Allergies as of 10/24/2022       Reactions   Cinnamon Anaphylaxis        Medication List     STOP taking these medications    benzocaine-Menthol 20-0.5 % Aero Commonly known as: DERMOPLAST   HYDROcodone-acetaminophen 5-325 MG tablet Commonly known as: NORCO/VICODIN   ibuprofen 600 MG tablet Commonly known as: ADVIL   naproxen 375 MG tablet Commonly known as: NAPROSYN   oxyCODONE 5 MG immediate release tablet Commonly known as: Oxy IR/ROXICODONE  senna-docusate 8.6-50 MG  tablet Commonly known as: Senokot-S       TAKE these medications    acetaminophen 500 MG tablet Commonly known as: TYLENOL Take 2 tablets (1,000 mg total) by mouth every 6 (six) hours as needed for mild pain or moderate pain.   cetirizine 5 MG tablet Commonly known as: ZYRTEC Take 5 mg by mouth daily.   ferrous sulfate 325 (65 FE) MG tablet Take 1 tablet (325 mg total) by mouth 2 (two) times daily with a meal.   ondansetron 8 MG disintegrating tablet Commonly known as: ZOFRAN-ODT Take 1 tablet (8 mg total) by mouth every 8 (eight) hours as needed for nausea or vomiting.   prenatal multivitamin Tabs tablet Take 1 tablet by mouth daily at 12 noon.        Follow-up Information     [place] in 2 day(s).   Why: routine prenatal appointment Contact information: [address] [place] [address] [phone]                ----- Margaretmary Eddy, CNM Certified Nurse Midwife [facility]

## 2022-10-24 NOTE — Progress Notes (Signed)
   10/24/22 1500  Spiritual Encounters  Type of Visit Initial  Care provided to: Pt and family  Referral source Chaplain assessment  Reason for visit Routine spiritual support  OnCall Visit No  Spiritual Framework  Presenting Themes Courage hope and growth  Interventions  Spiritual Care Interventions Made Established relationship of care and support;Compassionate presence;Encouragement  Intervention Outcomes  Outcomes Awareness of support  Spiritual Care Plan  Spiritual Care Issues Still Outstanding No further spiritual care needs at this time (see row info)   Chaplain provided compassionate care to patient and family.  Chaplain spiritual support services remain available as the need arises.

## 2022-11-30 LAB — OB RESULTS CONSOLE GBS: GBS: NEGATIVE

## 2022-12-08 ENCOUNTER — Other Ambulatory Visit (HOSPITAL_COMMUNITY): Payer: Self-pay

## 2022-12-18 ENCOUNTER — Inpatient Hospital Stay (HOSPITAL_COMMUNITY)
Admission: AD | Admit: 2022-12-18 | Discharge: 2022-12-20 | DRG: 807 | Disposition: A | Discharge status: Home / Self Care | Payer: 59 | Attending: Obstetrics and Gynecology | Admitting: Obstetrics and Gynecology

## 2022-12-18 ENCOUNTER — Other Ambulatory Visit: Payer: Self-pay

## 2022-12-18 ENCOUNTER — Encounter (HOSPITAL_COMMUNITY): Payer: Self-pay | Admitting: Obstetrics and Gynecology

## 2022-12-18 DIAGNOSIS — Z3A39 39 weeks gestation of pregnancy: Secondary | ICD-10-CM | POA: Diagnosis not present

## 2022-12-18 DIAGNOSIS — O26893 Other specified pregnancy related conditions, third trimester: Secondary | ICD-10-CM | POA: Diagnosis present

## 2022-12-18 DIAGNOSIS — O9832 Other infections with a predominantly sexual mode of transmission complicating childbirth: Secondary | ICD-10-CM | POA: Diagnosis present

## 2022-12-18 DIAGNOSIS — Z818 Family history of other mental and behavioral disorders: Secondary | ICD-10-CM

## 2022-12-18 DIAGNOSIS — Z833 Family history of diabetes mellitus: Secondary | ICD-10-CM

## 2022-12-18 DIAGNOSIS — Z3490 Encounter for supervision of normal pregnancy, unspecified, unspecified trimester: Principal | ICD-10-CM | POA: Diagnosis present

## 2022-12-18 DIAGNOSIS — A6000 Herpesviral infection of urogenital system, unspecified: Secondary | ICD-10-CM | POA: Diagnosis present

## 2022-12-18 LAB — CBC
HCT: 37.5 % (ref 36.0–46.0)
Hemoglobin: 12.5 g/dL (ref 12.0–15.0)
MCH: 27.9 pg (ref 26.0–34.0)
MCHC: 33.3 g/dL (ref 30.0–36.0)
MCV: 83.7 fL (ref 80.0–100.0)
Platelets: 196 10*3/uL (ref 150–400)
RBC: 4.48 MIL/uL (ref 3.87–5.11)
RDW: 15.1 % (ref 11.5–15.5)
WBC: 10.2 10*3/uL (ref 4.0–10.5)
nRBC: 0 % (ref 0.0–0.2)

## 2022-12-18 LAB — TYPE AND SCREEN
ABO/RH(D): AB POS
Antibody Screen: NEGATIVE

## 2022-12-18 MED ORDER — LIDOCAINE HCL (PF) 1 % IJ SOLN: 30.0000 mL | INTRAMUSCULAR | Status: DC | PRN

## 2022-12-18 MED ORDER — OXYCODONE-ACETAMINOPHEN 5-325 MG PO TABS: 2.0000 | ORAL_TABLET | ORAL | Status: DC | PRN

## 2022-12-18 MED ORDER — LACTATED RINGERS IV SOLN: INTRAVENOUS | Status: DC

## 2022-12-18 MED ORDER — ONDANSETRON HCL 4 MG/2ML IJ SOLN
4.0000 mg | Freq: Four times a day (QID) | INTRAMUSCULAR | Status: DC | PRN
  Administered 2022-12-19: 4 mg via INTRAVENOUS
  Filled 2022-12-18: qty 2

## 2022-12-18 MED ORDER — OXYTOCIN-SODIUM CHLORIDE 30-0.9 UT/500ML-% IV SOLN
1.0000 m[IU]/min | INTRAVENOUS | Status: DC
  Administered 2022-12-18: 2 m[IU]/min via INTRAVENOUS
  Administered 2022-12-19: 6 m[IU]/min via INTRAVENOUS
  Administered 2022-12-19: 4 m[IU]/min via INTRAVENOUS
  Filled 2022-12-18: qty 500

## 2022-12-18 MED ORDER — OXYTOCIN-SODIUM CHLORIDE 30-0.9 UT/500ML-% IV SOLN: 2.5000 [IU]/h | INTRAVENOUS | Status: DC

## 2022-12-18 MED ORDER — SOD CITRATE-CITRIC ACID 500-334 MG/5ML PO SOLN: 30.0000 mL | ORAL | Status: DC | PRN

## 2022-12-18 MED ORDER — LACTATED RINGERS IV SOLN
500.0000 mL | INTRAVENOUS | Status: DC | PRN
  Administered 2022-12-18: 500 mL via INTRAVENOUS

## 2022-12-18 MED ORDER — ZOLPIDEM TARTRATE 5 MG PO TABS: 5.0000 mg | ORAL_TABLET | Freq: Every evening | ORAL | Status: DC | PRN

## 2022-12-18 MED ORDER — ACETAMINOPHEN 325 MG PO TABS: 650.0000 mg | ORAL_TABLET | ORAL | Status: DC | PRN

## 2022-12-18 MED ORDER — OXYCODONE-ACETAMINOPHEN 5-325 MG PO TABS: 1.0000 | ORAL_TABLET | ORAL | Status: DC | PRN

## 2022-12-18 MED ORDER — TERBUTALINE SULFATE 1 MG/ML IJ SOLN: 0.2500 mg | Freq: Once | INTRAMUSCULAR | Status: DC | PRN

## 2022-12-18 MED ORDER — OXYTOCIN BOLUS FROM INFUSION
333.0000 mL | Freq: Once | INTRAVENOUS | Status: AC
  Administered 2022-12-19: 333 mL via INTRAVENOUS

## 2022-12-18 NOTE — H&P (Signed)
Savannah Mills is a 27 y.o. female presenting for elective IOL.  Pregnancy uncomplicated.  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 OB History     Gravida  2   Para  1   Term  1   Preterm      AB      Living  1      SAB      IAB      Ectopic      Multiple  0   Live Births  1        Obstetric Comments  Menstrual age: 64  Age 1st Pregnancy: N/A         Past Medical History:  Diagnosis Date   Anemia    Anxiety    Bilateral breast cysts    IBS (irritable bowel syndrome)    Past Surgical History:  Procedure Laterality Date   COLONOSCOPY  6 years   ingrown toenail excision     MINOR HEMORRHOIDECTOMY     UPPER GI ENDOSCOPY  6 years ago   WISDOM TOOTH EXTRACTION     Family History: family history includes Alcoholism in her mother; Anxiety disorder in her brother; Breast cancer in her maternal aunt; Cancer in her maternal grandmother and mother; Depression in her mother and sister; Diabetes in her maternal grandfather; Skin cancer in her maternal grandmother. Social History:  reports that she has never smoked. She has never used smokeless tobacco. She reports that she does not currently use alcohol. She reports that she does not use drugs.     Maternal Diabetes: No Genetic Screening: Normal Maternal Ultrasounds/Referrals: Normal Fetal Ultrasounds or other Referrals:  None Maternal Substance Abuse:  No Significant Maternal Medications:  None Significant Maternal Lab Results:  Group B Strep negative Number of Prenatal Visits:greater than 3 verified prenatal visits Maternal Vaccinations:TDap Other Comments:  None  Review of Systems History Dilation: 2 Effacement (%): 50 Station: -3 Exam by:: Letitia Neri, RN Height 5\' 6"  (1.676 m), weight 79.9 kg, unknown if currently breastfeeding. Exam Physical Exam  Vitals and nursing note reviewed. Exam conducted with a chaperone present.   Constitutional:      Appearance: Normal appearance.  HENT:     Head: Normocephalic.  Eyes:     Pupils: Pupils are equal, round, and reactive to light.  Cardiovascular:     Rate and Rhythm: Normal rate and regular rhythm.     Pulses: Normal pulses.  Abdominal:     General: Abdomen is Gravid, nontender Neurological:     Mental Status: She is alert. Prenatal labs: ABO, Rh:   Antibody:   Rubella:   RPR:    HBsAg:    HIV:    GBS:     Assessment/Plan: IUP at 39 weeks Elective IOL Plan Pitocin IOL due to frequent ctxs. No recent HSV lesions or outbreaks Anticipate SVD Desires CLEA   Turner Daniels 12/18/2022, 9:19 PM

## 2022-12-19 ENCOUNTER — Encounter (HOSPITAL_COMMUNITY): Payer: Self-pay | Admitting: Obstetrics and Gynecology

## 2022-12-19 ENCOUNTER — Inpatient Hospital Stay (HOSPITAL_COMMUNITY): Payer: 59 | Admitting: Anesthesiology

## 2022-12-19 LAB — RPR: RPR Ser Ql: NONREACTIVE

## 2022-12-19 MED ORDER — FENTANYL-BUPIVACAINE-NACL 0.5-0.125-0.9 MG/250ML-% EP SOLN
12.0000 mL/h | EPIDURAL | Status: DC | PRN
  Administered 2022-12-19: 12 mL/h via EPIDURAL
  Filled 2022-12-19: qty 250

## 2022-12-19 MED ORDER — OXYCODONE HCL 5 MG PO TABS: 10.0000 mg | ORAL_TABLET | ORAL | Status: DC | PRN

## 2022-12-19 MED ORDER — DIPHENHYDRAMINE HCL 25 MG PO CAPS: 25.0000 mg | ORAL_CAPSULE | Freq: Four times a day (QID) | ORAL | Status: DC | PRN

## 2022-12-19 MED ORDER — LACTATED RINGERS IV SOLN: 500.0000 mL | Freq: Once | INTRAVENOUS | Status: DC

## 2022-12-19 MED ORDER — TETANUS-DIPHTH-ACELL PERTUSSIS 5-2.5-18.5 LF-MCG/0.5 IM SUSY: 0.5000 mL | PREFILLED_SYRINGE | Freq: Once | INTRAMUSCULAR | Status: DC

## 2022-12-19 MED ORDER — DIPHENHYDRAMINE HCL 50 MG/ML IJ SOLN: 12.5000 mg | INTRAMUSCULAR | Status: DC | PRN

## 2022-12-19 MED ORDER — ONDANSETRON HCL 4 MG/2ML IJ SOLN: 4.0000 mg | INTRAMUSCULAR | Status: DC | PRN

## 2022-12-19 MED ORDER — PHENYLEPHRINE 80 MCG/ML (10ML) SYRINGE FOR IV PUSH (FOR BLOOD PRESSURE SUPPORT)
80.0000 ug | PREFILLED_SYRINGE | INTRAVENOUS | Status: DC | PRN
  Filled 2022-12-19: qty 10

## 2022-12-19 MED ORDER — PRENATAL MULTIVITAMIN CH
1.0000 | ORAL_TABLET | Freq: Every day | ORAL | Status: DC
  Administered 2022-12-20: 1 via ORAL
  Filled 2022-12-19: qty 1

## 2022-12-19 MED ORDER — ACETAMINOPHEN 325 MG PO TABS
650.0000 mg | ORAL_TABLET | ORAL | Status: DC | PRN
  Administered 2022-12-20: 650 mg via ORAL
  Filled 2022-12-19: qty 2

## 2022-12-19 MED ORDER — LIDOCAINE-EPINEPHRINE (PF) 2 %-1:200000 IJ SOLN
INTRAMUSCULAR | Status: DC | PRN
  Administered 2022-12-19: 3 mL via EPIDURAL
  Administered 2022-12-19 (×2): 5 mL via EPIDURAL
  Administered 2022-12-19: 3 mL via EPIDURAL

## 2022-12-19 MED ORDER — EPHEDRINE 5 MG/ML INJ: 10.0000 mg | INTRAVENOUS | Status: DC | PRN

## 2022-12-19 MED ORDER — COCONUT OIL OIL: 1.0000 | TOPICAL_OIL | Status: DC | PRN

## 2022-12-19 MED ORDER — ZOLPIDEM TARTRATE 5 MG PO TABS: 5.0000 mg | ORAL_TABLET | Freq: Every evening | ORAL | Status: DC | PRN

## 2022-12-19 MED ORDER — SENNOSIDES-DOCUSATE SODIUM 8.6-50 MG PO TABS
2.0000 | ORAL_TABLET | ORAL | Status: DC
  Administered 2022-12-20: 2 via ORAL
  Filled 2022-12-19: qty 2

## 2022-12-19 MED ORDER — PHENYLEPHRINE 80 MCG/ML (10ML) SYRINGE FOR IV PUSH (FOR BLOOD PRESSURE SUPPORT): 80.0000 ug | PREFILLED_SYRINGE | INTRAVENOUS | Status: DC | PRN

## 2022-12-19 MED ORDER — SIMETHICONE 80 MG PO CHEW: 80.0000 mg | CHEWABLE_TABLET | ORAL | Status: DC | PRN

## 2022-12-19 MED ORDER — ONDANSETRON HCL 4 MG PO TABS: 4.0000 mg | ORAL_TABLET | ORAL | Status: DC | PRN

## 2022-12-19 MED ORDER — DIBUCAINE (PERIANAL) 1 % EX OINT: 1.0000 | TOPICAL_OINTMENT | CUTANEOUS | Status: DC | PRN

## 2022-12-19 MED ORDER — OXYCODONE HCL 5 MG PO TABS: 5.0000 mg | ORAL_TABLET | ORAL | Status: DC | PRN

## 2022-12-19 MED ORDER — BUPIVACAINE HCL (PF) 0.25 % IJ SOLN
INTRAMUSCULAR | Status: DC | PRN
  Administered 2022-12-19 (×2): 3 mL via EPIDURAL

## 2022-12-19 MED ORDER — BENZOCAINE-MENTHOL 20-0.5 % EX AERO
1.0000 | INHALATION_SPRAY | CUTANEOUS | Status: DC | PRN
  Administered 2022-12-19: 1 via TOPICAL
  Filled 2022-12-19: qty 56

## 2022-12-19 MED ORDER — IBUPROFEN 600 MG PO TABS
600.0000 mg | ORAL_TABLET | Freq: Four times a day (QID) | ORAL | Status: DC
  Administered 2022-12-19 – 2022-12-20 (×4): 600 mg via ORAL
  Filled 2022-12-19 (×4): qty 1

## 2022-12-19 MED ORDER — WITCH HAZEL-GLYCERIN EX PADS: 1.0000 | MEDICATED_PAD | CUTANEOUS | Status: DC | PRN

## 2022-12-19 NOTE — Anesthesia Procedure Notes (Signed)
Epidural Patient location during procedure: OB Start time: 12/19/2022 12:58 PM End time: 12/19/2022 1:14 PM  Staffing Anesthesiologist: Val Eagle, MD Performed: anesthesiologist   Preanesthetic Checklist Completed: patient identified, IV checked, risks and benefits discussed, monitors and equipment checked, pre-op evaluation and timeout performed  Epidural Patient position: sitting Prep: DuraPrep Patient monitoring: heart rate, continuous pulse ox and blood pressure Approach: midline Location: L4-L5 Injection technique: LOR saline  Needle:  Needle type: Tuohy  Needle gauge: 17 G Needle length: 9 cm Needle insertion depth: 7 cm Catheter type: closed end flexible Catheter size: 19 Gauge Catheter at skin depth: 14 cm Test dose: negative and 2% lidocaine with Epi 1:200 K  Assessment Events: blood not aspirated, no cerebrospinal fluid, injection not painful, no injection resistance, no paresthesia and negative IV test  Additional Notes Reason for block:procedure for pain

## 2022-12-19 NOTE — Anesthesia Preprocedure Evaluation (Signed)
Anesthesia Evaluation  Patient identified by MRN, date of birth, ID band Patient awake    Reviewed: Allergy & Precautions, Patient's Chart, lab work & pertinent test results  History of Anesthesia Complications Negative for: history of anesthetic complications  Airway Mallampati: II  TM Distance: >3 FB Neck ROM: Full    Dental no notable dental hx.    Pulmonary neg pulmonary ROS   breath sounds clear to auscultation       Cardiovascular negative cardio ROS  Rhythm:Regular     Neuro/Psych negative neurological ROS  negative psych ROS   GI/Hepatic negative GI ROS, Neg liver ROS,,,  Endo/Other  negative endocrine ROS    Renal/GU negative Renal ROS     Musculoskeletal   Abdominal   Peds  Hematology negative hematology ROS (+) Lab Results      Component                Value               Date                      WBC                      10.2                12/18/2022                HGB                      12.5                12/18/2022                HCT                      37.5                12/18/2022                MCV                      83.7                12/18/2022                PLT                      196                 12/18/2022              Anesthesia Other Findings   Reproductive/Obstetrics (+) Pregnancy                             Anesthesia Physical Anesthesia Plan  ASA: 2  Anesthesia Plan: Epidural   Post-op Pain Management:    Induction:   PONV Risk Score and Plan: 2 and Treatment may vary due to age or medical condition  Airway Management Planned: Natural Airway  Additional Equipment: None  Intra-op Plan:   Post-operative Plan:   Informed Consent: I have reviewed the patients History and Physical, chart, labs and discussed the procedure including the risks, benefits and alternatives for the proposed anesthesia with the patient or authorized  representative who has indicated his/her understanding and acceptance.  Plan Discussed with: Anesthesiologist  Anesthesia Plan Comments:        Anesthesia Quick Evaluation

## 2022-12-19 NOTE — Progress Notes (Signed)
Delivery Note At 3:16 PM a viable female was delivered via Vaginal, Spontaneous (Presentation: Right Occiput Anterior).  APGAR: , ; weight  .   Placenta status: Spontaneous, Intact.  Cord: 3 vessels with the following complications: None.  Cord pH:  Rapid second stage Anesthesia: Epidural Episiotomy:   Lacerations:  second degree midline anterior and posterior lacs repaired Suture Repair: 2.0 vicryl rapide Est. Blood Loss (mL):  100  Mom to postpartum.  Baby to Couplet care / Skin to Skin.  Roselle Locus II 12/19/2022, 3:31 PM

## 2022-12-19 NOTE — Anesthesia Procedure Notes (Signed)
Epidural Patient location during procedure: OB Start time: 12/19/2022 1:58 PM End time: 12/19/2022 2:16 PM  Staffing Anesthesiologist: Val Eagle, MD Performed: anesthesiologist   Preanesthetic Checklist Completed: patient identified, IV checked, risks and benefits discussed, monitors and equipment checked, pre-op evaluation and timeout performed  Epidural Patient position: sitting Prep: DuraPrep Patient monitoring: heart rate, continuous pulse ox and blood pressure Approach: midline Location: L3-L4 Injection technique: LOR saline  Needle:  Needle type: Tuohy  Needle gauge: 17 G Needle length: 9 cm Needle insertion depth: 7 cm Catheter type: closed end flexible Catheter size: 19 Gauge Catheter at skin depth: 14 cm Test dose: negative and 2% lidocaine with Epi 1:200 K  Assessment Events: blood not aspirated, no cerebrospinal fluid, injection not painful, no injection resistance, no paresthesia and negative IV test

## 2022-12-19 NOTE — Progress Notes (Signed)
FHT cat one UCs q2-3 min Cx 2-3/th/soft/-3/vtx  A/P: D/W patient IOL. Continue pitocin

## 2022-12-20 LAB — CBC
HCT: 33.4 % — ABNORMAL LOW (ref 36.0–46.0)
Hemoglobin: 11 g/dL — ABNORMAL LOW (ref 12.0–15.0)
MCH: 27.9 pg (ref 26.0–34.0)
MCHC: 32.9 g/dL (ref 30.0–36.0)
MCV: 84.8 fL (ref 80.0–100.0)
Platelets: 160 10*3/uL (ref 150–400)
RBC: 3.94 MIL/uL (ref 3.87–5.11)
RDW: 15.2 % (ref 11.5–15.5)
WBC: 9.7 10*3/uL (ref 4.0–10.5)
nRBC: 0 % (ref 0.0–0.2)

## 2022-12-20 MED ORDER — IBUPROFEN 600 MG PO TABS: 600.0000 mg | ORAL_TABLET | Freq: Four times a day (QID) | ORAL | 0 refills | Status: AC

## 2022-12-20 NOTE — Anesthesia Postprocedure Evaluation (Signed)
Anesthesia Post Note  Patient: Savannah Mills  Procedure(s) Performed: AN AD HOC LABOR EPIDURAL     Patient location during evaluation: Mother Baby Anesthesia Type: Epidural Level of consciousness: awake and alert and oriented Pain management: satisfactory to patient Vital Signs Assessment: post-procedure vital signs reviewed and stable Respiratory status: respiratory function stable Cardiovascular status: stable Postop Assessment: no headache, epidural receding, patient able to bend at knees, no signs of nausea or vomiting and adequate PO intake Anesthetic complications: no Comments: The patient reported back pain at epidural insertion site and lower lumbar/sacral area. She is using heating pads for relief   No notable events documented.  Last Vitals:  Vitals:   12/20/22 0244 12/20/22 0638  BP: 107/76 115/80  Pulse: 70 83  Resp: 16 16  Temp: 36.8 C 37.1 C  SpO2: 98% 99%    Last Pain:  Vitals:   12/20/22 0638  TempSrc: Oral  PainSc: 2    Pain Goal:                   Eduin Friedel

## 2022-12-20 NOTE — Discharge Instructions (Signed)
Call MD for T>100.4, heavy vaginal bleeding, severe abdominal pain, or respiratory distress.  Call office to schedule postpartum visit in 6 weeks.  Pelvic rest x 6 weeks.

## 2022-12-20 NOTE — Social Work (Signed)
MOB was referred for history of depression/anxiety.  * Referral screened out by Clinical Social Worker because none of the following criteria appear to apply:  ~ History of anxiety/depression during this pregnancy, or of post-partum depression following prior delivery.  ~ Diagnosis of anxiety and/or depression within last 3 years OR * MOB's symptoms currently being treated with medication and/or therapy.  Per OB records MOB's anxiety dates back to high school, no current concerns noted.   Please contact the Clinical Social Worker if needs arise, or by MOB request.   Wende Neighbors, LCSWA Clinical Social Worker [phone]

## 2022-12-20 NOTE — Discharge Summary (Signed)
Postpartum Discharge Summary    Patient Name: Savannah Mills DOB: 09-29-1995 MRN: [id number]  Date of admission: 12/18/2022 Delivery date:12/19/2022 Delivering provider: Harold Hedge Date of discharge: 12/20/2022  Admitting diagnosis: Encounter for elective induction of labor [Z34.90] Intrauterine pregnancy: [redacted]w[redacted]d     Secondary diagnosis:  Principal Problem:   Encounter for elective induction of labor  Additional problems: none    Discharge diagnosis: Term Pregnancy Delivered                                              Post partum procedures: none Augmentation: Pitocin Complications: None  Hospital course: Induction of Labor With Vaginal Delivery   27 y.o. yo G2P2002 at [redacted]w[redacted]d was admitted to the hospital 12/18/2022 for induction of labor.  Indication for induction: Elective.  Patient had an labor course complicated by n/a Membrane Rupture Time/Date: 12:20 PM,12/19/2022  Delivery Method:Vaginal, Spontaneous Operative Delivery:N/A Episiotomy: None Lacerations:  2nd degree Details of delivery can be found in separate delivery note.  Patient had a postpartum course complicated by n/a. Patient is discharged home 12/20/22.  Newborn Data: Birth date:12/19/2022 Birth time:3:16 PM Gender:Female Living status:Living Apgars:9 ,9  Weight:3820 g  Magnesium Sulfate received: No BMZ received: No Rhophylac:No MMR:No T-DaP:Given prenatally Flu: No RSV Vaccine received: No Transfusion:No  Immunizations received: There is no immunization history for the selected administration types on file for this patient.  Physical exam  Vitals:   12/19/22 1825 12/19/22 2228 12/20/22 0244 12/20/22 0638  BP: 125/80 110/75 107/76 115/80  Pulse: 66 81 70 83  Resp:  16 16 16   Temp:  98.2 F (36.8 C) 98.2 F (36.8 C) 98.8 F (37.1 C)  TempSrc:  Oral Oral Oral  SpO2: 100% 98% 98% 99%  Weight:      Height:       General: alert, cooperative, and no distress Lochia: appropriate Uterine  Fundus: firm Incision: Healing well with no significant drainage, No significant erythema DVT Evaluation: No evidence of DVT seen on physical exam. Negative Homan's sign. No cords or calf tenderness. Labs: Lab Results  Component Value Date   WBC 9.7 12/20/2022   HGB 11.0 (L) 12/20/2022   HCT 33.4 (L) 12/20/2022   MCV 84.8 12/20/2022   PLT 160 12/20/2022      Latest Ref Rng & Units 10/24/2022    9:20 AM  CMP  Glucose 70 - 99 mg/dL 83   BUN 6 - 20 mg/dL 12   Creatinine 4.09 - 1.00 mg/dL 8.11   Sodium 914 - 782 mmol/L 135   Potassium 3.5 - 5.1 mmol/L 3.8   Chloride 98 - 111 mmol/L 106   CO2 22 - 32 mmol/L 20   Calcium 8.9 - 10.3 mg/dL 8.4   Total Protein 6.5 - 8.1 g/dL 6.2   Total Bilirubin 0.3 - 1.2 mg/dL 0.5   Alkaline Phos 38 - 126 U/L 79   AST 15 - 41 U/L 14   ALT 0 - 44 U/L 10    Edinburgh Score:    12/19/2022    5:25 PM  Edinburgh Postnatal Depression Scale Screening Tool  I have been able to laugh and see the funny side of things. 0  I have looked forward with enjoyment to things. 0  I have blamed myself unnecessarily when things went wrong. 1  I have been anxious or worried for  no good reason. 2  I have felt scared or panicky for no good reason. 0  Things have been getting on top of me. 0  I have been so unhappy that I have had difficulty sleeping. 0  I have felt sad or miserable. 1  I have been so unhappy that I have been crying. 1  The thought of harming myself has occurred to me. 0  Edinburgh Postnatal Depression Scale Total 5   Edinburgh Postnatal Depression Scale Total: 5   After visit meds:  Allergies as of 12/20/2022       Reactions   Cinnamon Anaphylaxis        Medication List     STOP taking these medications    cetirizine 5 MG tablet Commonly known as: ZYRTEC   ferrous sulfate 325 (65 FE) MG tablet   ondansetron 8 MG disintegrating tablet Commonly known as: ZOFRAN-ODT       TAKE these medications    acetaminophen 500 MG  tablet Commonly known as: TYLENOL Take 2 tablets (1,000 mg total) by mouth every 6 (six) hours as needed for mild pain or moderate pain.   ibuprofen 600 MG tablet Commonly known as: ADVIL Take 1 tablet (600 mg total) by mouth every 6 (six) hours.   prenatal multivitamin Tabs tablet Take 1 tablet by mouth daily at 12 noon.         Discharge home in stable condition Infant Feeding: Bottle Infant Disposition:home with mother Discharge instruction: per After Visit Summary and Postpartum booklet. Activity: Advance as tolerated. Pelvic rest for 6 weeks.  Diet: routine diet Future Appointments:No future appointments. Follow up Visit: 6 weeks PPV  12/20/2022 Mitchel Honour, DO

## 2022-12-20 NOTE — Lactation Note (Signed)
This note was copied from a baby's chart. Lactation Consultation Note  Patient Name: Savannah Mills [id number] Date: 12/20/2022 Age:27 hours, P2  Reason for consult: Initial assessment;Term;Infant weight loss (3 % weight loss) Per  mom attempted to latch at 1st since just bottle feeding.  Per mom I'm not really in love with breast feeding and probably will just formula feed. LC recommended if she changes her mind to call on the nurses light or have her nurse call LC.    Maternal Data    Feeding Mother's Current Feeding Choice: Formula    Interventions Interventions: Education  Discharge    Consult Status Consult Status: Complete Date: 12/20/22    Kathrin Greathouse 12/20/2022, 1:46 PM

## 2022-12-20 NOTE — Progress Notes (Signed)
Post Partum Day 1 Subjective: no complaints, up ad lib, voiding, and tolerating PO  Objective: Blood pressure 115/80, pulse 83, temperature 98.8 F (37.1 C), temperature source Oral, resp. rate 16, height 5\' 6"  (1.676 m), weight 79.9 kg, SpO2 99%, unknown if currently breastfeeding.  Physical Exam:  General: alert, cooperative, and appears stated age Lochia: appropriate Uterine Fundus: firm Incision: healing well, no significant drainage DVT Evaluation: No evidence of DVT seen on physical exam. Negative Homan's sign. No cords or calf tenderness.  Recent Labs    12/18/22 2125 12/20/22 0547  HGB 12.5 11.0*  HCT 37.5 33.4*    Assessment/Plan: Discharge home   LOS: 2 days   Mitchel Honour, DO 12/20/2022, 8:31 AM

## 2022-12-28 ENCOUNTER — Inpatient Hospital Stay (HOSPITAL_COMMUNITY): Payer: 59

## 2023-01-15 ENCOUNTER — Telehealth (HOSPITAL_COMMUNITY): Payer: Self-pay | Admitting: *Deleted

## 2023-01-15 NOTE — Telephone Encounter (Signed)
01/15/2023  Name: Savannah Mills MRN: [id number] DOB: March 01, 1996  Reason for Call:  Transition of Care Hospital Discharge Call  Contact Status: Patient Contact Status: Complete  Language assistant needed: Interpreter Mode: Interpreter Not Needed        Follow-Up Questions: Do You Have Any Concerns About Your Health As You Heal From Delivery?: No Do You Have Any Concerns About Your Infants Health?: No  Edinburgh Postnatal Depression Scale:  In the Past 7 Days: I have been able to laugh and see the funny side of things.: As much as I always could I have looked forward with enjoyment to things.: As much as I ever did I have blamed myself unnecessarily when things went wrong.: No, never I have been anxious or worried for no good reason.: No, not at all I have felt scared or panicky for no good reason.: No, not at all Things have been getting on top of me.: No, I have been coping as well as ever I have been so unhappy that I have had difficulty sleeping.: Not at all I have felt sad or miserable.: No, not at all I have been so unhappy that I have been crying.: No, never The thought of harming myself has occurred to me.: Never Inocente Salles Postnatal Depression Scale Total: 0  PHQ2-9 Depression Scale:     Discharge Follow-up: Edinburgh score requires follow up?: No Patient was advised of the following resources:: Breastfeeding Support Group, Support Group  Post-discharge interventions: Reviewed Newborn Safe Sleep Practices  Salena Saner, RN 01/15/2023 16:43
